# Patient Record
Sex: Female | Born: 1980 | Hispanic: Yes | Marital: Married | Smoking: Never smoker
Health system: Southern US, Community
[De-identification: ages and names within clinical notes are randomized; demographics above are authoritative.]

## PROBLEM LIST (undated history)

## (undated) DIAGNOSIS — R519 Headache, unspecified: Secondary | ICD-10-CM

## (undated) DIAGNOSIS — R51 Headache: Secondary | ICD-10-CM

## (undated) HISTORY — PX: CHOLECYSTECTOMY: SHX55

---

## 2006-02-08 ENCOUNTER — Observation Stay: Payer: Self-pay

## 2006-02-15 ENCOUNTER — Observation Stay: Payer: Self-pay | Admitting: Obstetrics & Gynecology

## 2006-03-02 ENCOUNTER — Observation Stay: Payer: Self-pay

## 2006-03-04 ENCOUNTER — Observation Stay: Payer: Self-pay | Admitting: Obstetrics and Gynecology

## 2006-03-09 ENCOUNTER — Emergency Department: Payer: Self-pay | Admitting: Unknown Physician Specialty

## 2006-03-31 ENCOUNTER — Observation Stay: Payer: Self-pay | Admitting: Obstetrics and Gynecology

## 2006-04-29 ENCOUNTER — Observation Stay: Payer: Self-pay

## 2006-05-20 ENCOUNTER — Inpatient Hospital Stay: Payer: Self-pay | Admitting: Obstetrics and Gynecology

## 2006-07-04 ENCOUNTER — Observation Stay: Payer: Self-pay | Admitting: General Surgery

## 2006-07-09 ENCOUNTER — Ambulatory Visit: Payer: Self-pay | Admitting: General Surgery

## 2007-05-31 ENCOUNTER — Inpatient Hospital Stay: Payer: Self-pay | Admitting: Internal Medicine

## 2007-08-22 ENCOUNTER — Emergency Department: Payer: Self-pay | Admitting: Emergency Medicine

## 2007-10-02 ENCOUNTER — Other Ambulatory Visit: Payer: Self-pay

## 2007-10-02 ENCOUNTER — Emergency Department: Payer: Self-pay | Admitting: Emergency Medicine

## 2008-01-31 ENCOUNTER — Emergency Department: Payer: Self-pay | Admitting: Emergency Medicine

## 2009-03-17 ENCOUNTER — Emergency Department: Payer: Self-pay | Admitting: Emergency Medicine

## 2009-04-05 ENCOUNTER — Inpatient Hospital Stay: Payer: Self-pay | Admitting: Internal Medicine

## 2009-06-29 HISTORY — PX: CHOLECYSTECTOMY: SHX55

## 2009-09-28 ENCOUNTER — Emergency Department: Payer: Self-pay | Admitting: Emergency Medicine

## 2009-10-13 ENCOUNTER — Emergency Department: Payer: Self-pay | Admitting: Internal Medicine

## 2011-05-28 ENCOUNTER — Emergency Department: Payer: Self-pay | Admitting: Emergency Medicine

## 2011-07-09 ENCOUNTER — Ambulatory Visit: Payer: Self-pay | Admitting: Family Medicine

## 2011-07-29 ENCOUNTER — Ambulatory Visit: Payer: Self-pay | Admitting: Family Medicine

## 2011-09-11 ENCOUNTER — Ambulatory Visit: Payer: Self-pay | Admitting: Family Medicine

## 2011-09-13 ENCOUNTER — Observation Stay: Payer: Self-pay | Admitting: Obstetrics and Gynecology

## 2011-09-14 LAB — URINALYSIS, COMPLETE
Glucose,UR: NEGATIVE mg/dL (ref 0–75)
Leukocyte Esterase: NEGATIVE
Nitrite: NEGATIVE
Ph: 8 (ref 4.5–8.0)
Specific Gravity: 1.006 (ref 1.003–1.030)
WBC UR: 1 /HPF (ref 0–5)

## 2011-09-15 LAB — URINE CULTURE

## 2011-11-02 ENCOUNTER — Observation Stay: Payer: Self-pay | Admitting: Obstetrics and Gynecology

## 2011-11-02 LAB — PIH PROFILE
Anion Gap: 10 (ref 7–16)
BUN: 5 mg/dL — ABNORMAL LOW (ref 7–18)
Chloride: 107 mmol/L (ref 98–107)
Co2: 24 mmol/L (ref 21–32)
EGFR (African American): >60
Glucose: 94 mg/dL (ref 65–99)
MCH: 31 pg (ref 26.0–34.0)
MCHC: 34.1 g/dL (ref 32.0–36.0)
MCV: 91 fL (ref 80–100)
Platelet: 113 10*3/uL — ABNORMAL LOW (ref 150–440)
Potassium: 3.5 mmol/L (ref 3.5–5.1)
RBC: 4.1 10*6/uL (ref 3.80–5.20)
SGOT(AST): 19 U/L (ref 15–37)
WBC: 12.5 10*3/uL — ABNORMAL HIGH (ref 3.6–11.0)

## 2011-11-02 LAB — URINALYSIS, COMPLETE
Bilirubin,UR: NEGATIVE
Glucose,UR: NEGATIVE mg/dL (ref 0–75)
RBC,UR: NONE SEEN /HPF (ref 0–5)
Specific Gravity: 1.005 (ref 1.003–1.030)
WBC UR: 1 /HPF (ref 0–5)

## 2011-11-02 LAB — PROTEIN / CREATININE RATIO, URINE
Protein, Random Urine: 7 mg/dL (ref 0–12)
Protein/Creat. Ratio: 258 mg/gCREAT — ABNORMAL HIGH (ref 0–200)

## 2011-11-02 LAB — FETAL FIBRONECTIN
Appearance: NORMAL
Fetal Fibronectin: NEGATIVE

## 2011-11-16 ENCOUNTER — Observation Stay: Payer: Self-pay | Admitting: Obstetrics and Gynecology

## 2011-12-03 ENCOUNTER — Inpatient Hospital Stay: Payer: Self-pay

## 2011-12-04 LAB — COMPREHENSIVE METABOLIC PANEL
Albumin: 2.6 g/dL — ABNORMAL LOW (ref 3.4–5.0)
Anion Gap: 12 (ref 7–16)
BUN: 10 mg/dL (ref 7–18)
Bilirubin,Total: 0.4 mg/dL (ref 0.2–1.0)
Calcium, Total: 8.4 mg/dL — ABNORMAL LOW (ref 8.5–10.1)
Co2: 22 mmol/L (ref 21–32)
EGFR (African American): >60
Osmolality: 280 (ref 275–301)
SGOT(AST): 26 U/L (ref 15–37)
Sodium: 141 mmol/L (ref 136–145)
Total Protein: 7 g/dL (ref 6.4–8.2)

## 2011-12-04 LAB — CBC WITH DIFFERENTIAL/PLATELET
Basophil #: 0 10*3/uL
Basophil %: 0.1 %
Basophil %: 0.4 %
Eosinophil #: 1.1 10*3/uL — ABNORMAL HIGH
Eosinophil #: 0.3 10*3/uL (ref 0.0–0.7)
Eosinophil %: 8.5 %
HCT: 37.9 %
HGB: 13.1 g/dL
HGB: 13 g/dL (ref 12.0–16.0)
Lymphocyte #: 1.6 10*3/uL (ref 1.0–3.6)
Lymphocyte %: 8.5 %
Lymphocyte %: 11.6 %
Lymphs Abs: 1.1 10*3/uL
MCH: 31.4 pg
MCH: 30.4 pg (ref 26.0–34.0)
MCHC: 34.5 g/dL
MCHC: 33 g/dL (ref 32.0–36.0)
MCV: 91 fL
MCV: 92 fL (ref 80–100)
Monocyte #: 0.4 10*3/uL
Monocyte %: 3.3 %
Neutrophil #: 10.5 10*3/uL — ABNORMAL HIGH
Neutrophil #: 11 10*3/uL — ABNORMAL HIGH (ref 1.4–6.5)
Neutrophil %: 79.6 %
Neutrophil %: 80.2 %
Platelet: 107 10*3/uL — ABNORMAL LOW
RBC: 4.16 X10 6/mm 3
RBC: 4.26 10*6/uL (ref 3.80–5.20)
RDW: 14.2 %
RDW: 14.5 % (ref 11.5–14.5)
WBC: 13.2 10*3/uL — ABNORMAL HIGH
WBC: 13.7 10*3/uL — ABNORMAL HIGH (ref 3.6–11.0)

## 2011-12-05 LAB — HEMATOCRIT: HCT: 29.2 % — ABNORMAL LOW (ref 35.0–47.0)

## 2011-12-06 LAB — COMPREHENSIVE METABOLIC PANEL
Albumin: 1.9 g/dL — ABNORMAL LOW (ref 3.4–5.0)
Alkaline Phosphatase: 190 U/L — ABNORMAL HIGH (ref 50–136)
Anion Gap: 11 (ref 7–16)
BUN: 6 mg/dL — ABNORMAL LOW (ref 7–18)
Bilirubin,Total: 0.3 mg/dL (ref 0.2–1.0)
Chloride: 107 mmol/L (ref 98–107)
Creatinine: 0.44 mg/dL — ABNORMAL LOW (ref 0.60–1.30)
EGFR (African American): >60
EGFR (Non-African Amer.): >60
Glucose: 92 mg/dL (ref 65–99)
Osmolality: 284 (ref 275–301)
SGOT(AST): 28 U/L (ref 15–37)
SGPT (ALT): 18 U/L
Sodium: 144 mmol/L (ref 136–145)
Total Protein: 6.1 g/dL — ABNORMAL LOW (ref 6.4–8.2)

## 2011-12-06 LAB — URINALYSIS, COMPLETE
Blood: NEGATIVE
Glucose,UR: NEGATIVE mg/dL (ref 0–75)
Ketone: NEGATIVE
Nitrite: NEGATIVE
Ph: 7 (ref 4.5–8.0)
Protein: NEGATIVE
RBC,UR: <1 /HPF (ref 0–5)
Specific Gravity: 1.008 (ref 1.003–1.030)
WBC UR: <1 /HPF (ref 0–5)

## 2011-12-06 LAB — CBC WITH DIFFERENTIAL/PLATELET
Eosinophil #: 0.2 10*3/uL (ref 0.0–0.7)
Eosinophil %: 1.6 %
HCT: 28 % — ABNORMAL LOW (ref 35.0–47.0)
Lymphocyte #: 1.5 10*3/uL (ref 1.0–3.6)
Lymphocyte %: 11.2 %
MCH: 30.9 pg (ref 26.0–34.0)
MCHC: 33.7 g/dL (ref 32.0–36.0)
Monocyte #: 0.9 x10 3/mm (ref 0.2–0.9)
Monocyte %: 6.6 %
Neutrophil #: 10.9 10*3/uL — ABNORMAL HIGH (ref 1.4–6.5)
Neutrophil %: 80.4 %
Platelet: 110 10*3/uL — ABNORMAL LOW (ref 150–440)
RBC: 3.05 10*6/uL — ABNORMAL LOW (ref 3.80–5.20)
RDW: 14.6 % — ABNORMAL HIGH (ref 11.5–14.5)
WBC: 13.5 10*3/uL — ABNORMAL HIGH (ref 3.6–11.0)

## 2011-12-07 LAB — CBC WITH DIFFERENTIAL/PLATELET
HCT: 27.7 % — ABNORMAL LOW (ref 35.0–47.0)
HGB: 9.5 g/dL — ABNORMAL LOW (ref 12.0–16.0)
Lymphocyte #: 1.4 10*3/uL (ref 1.0–3.6)
MCHC: 34.1 g/dL (ref 32.0–36.0)
MCV: 92 fL (ref 80–100)
Monocyte %: 3.9 %
Neutrophil %: 78.9 %
RBC: 3 10*6/uL — ABNORMAL LOW (ref 3.80–5.20)
RDW: 14.4 % (ref 11.5–14.5)
WBC: 11.6 10*3/uL — ABNORMAL HIGH (ref 3.6–11.0)

## 2011-12-07 LAB — PATHOLOGY REPORT

## 2012-12-04 IMAGING — US US OB FOLLOW-UP
1 series · 14 of 28 positions shown · non-contrast
Comparison: none

REASON FOR EXAM: TB medication  low level anatomy
COMMENTS:

[Series 1: us ob follow-up · 0.33mm/px · 14 of 62 slices shown]
[im 3/62]
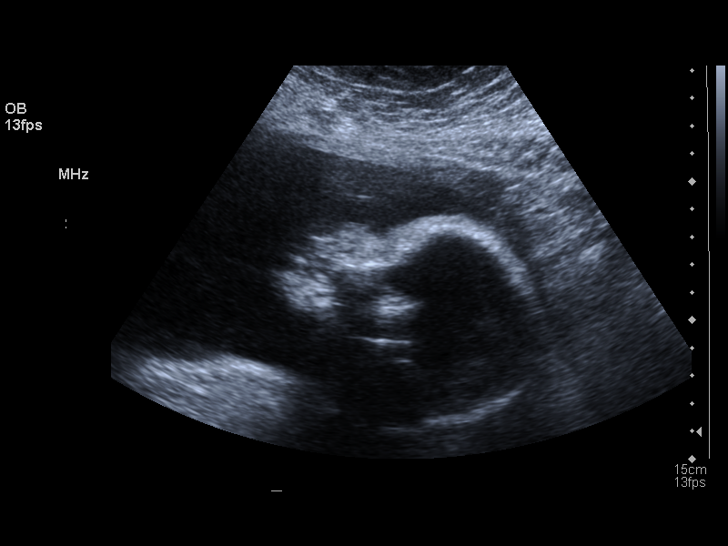
[im 7/62]
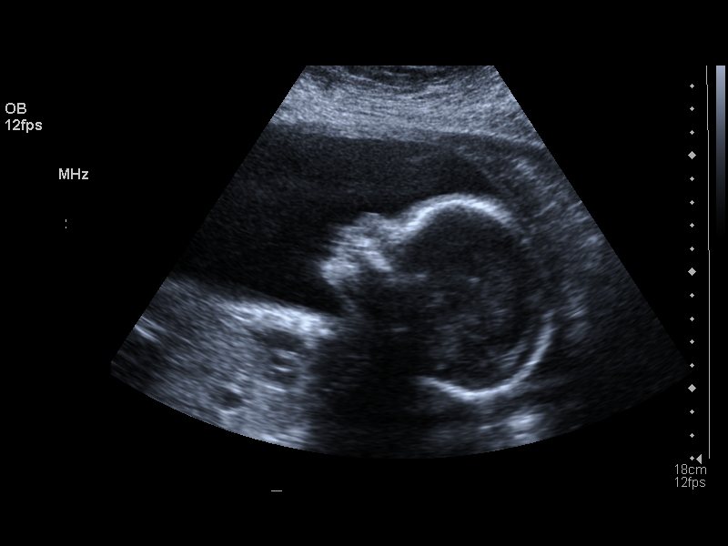
[im 12/62]
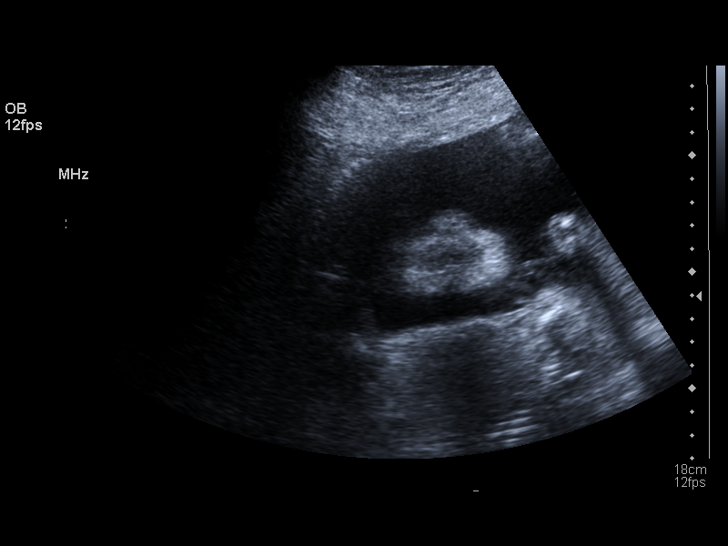
[im 16/62]
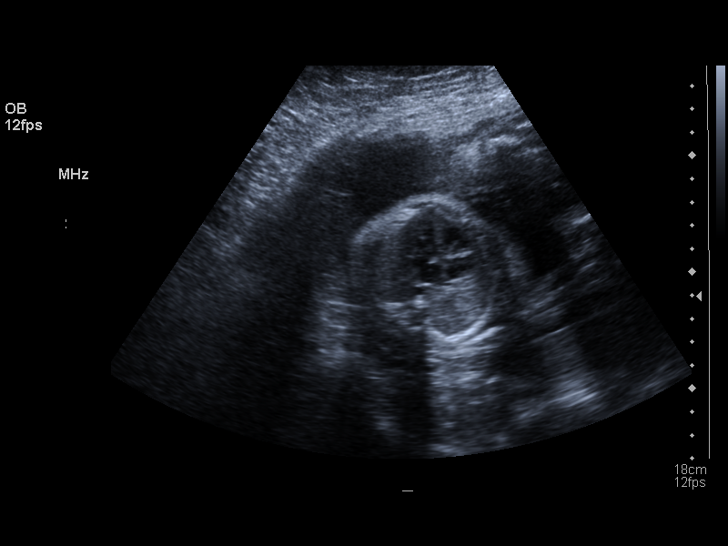
[im 21/62]
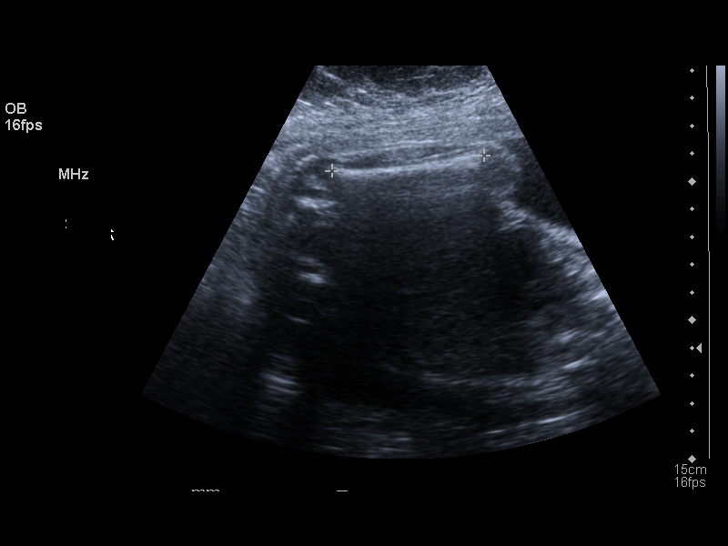
[im 25/62]
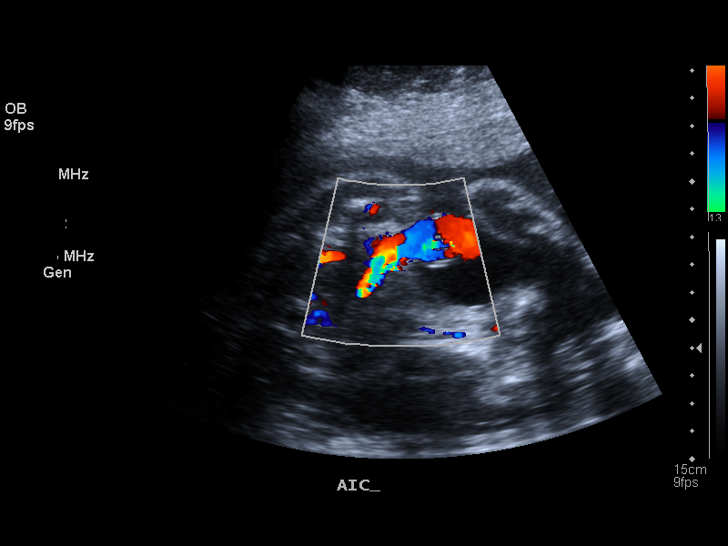
[im 30/62]
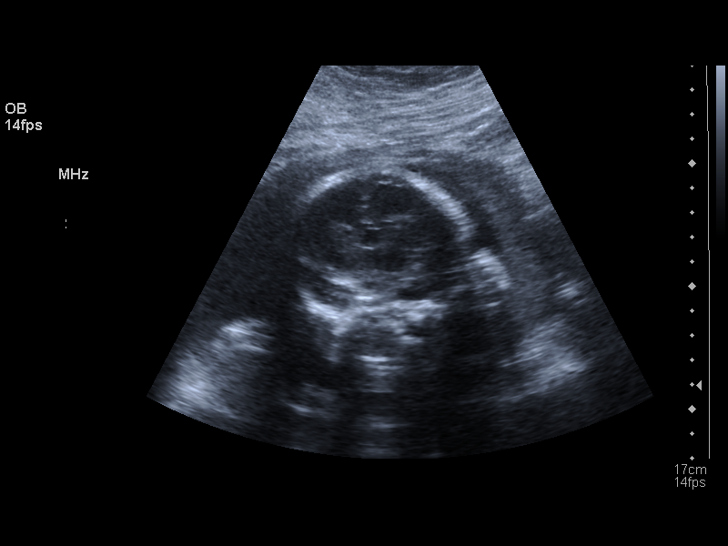
[im 34/62]
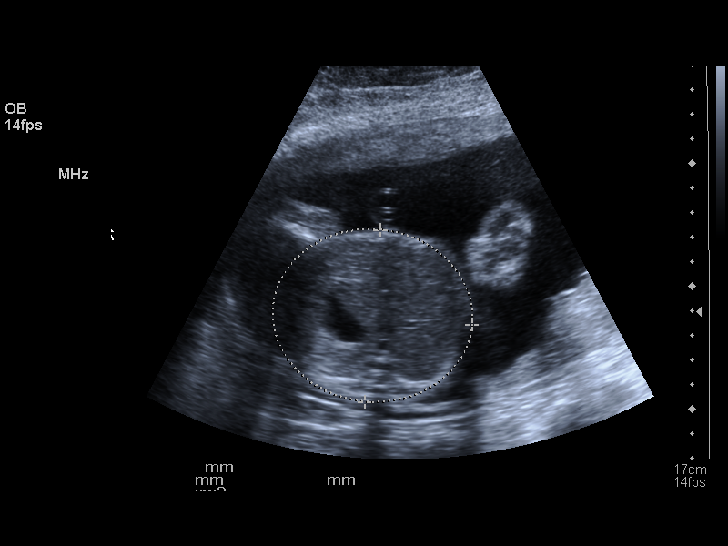
[im 39/62]
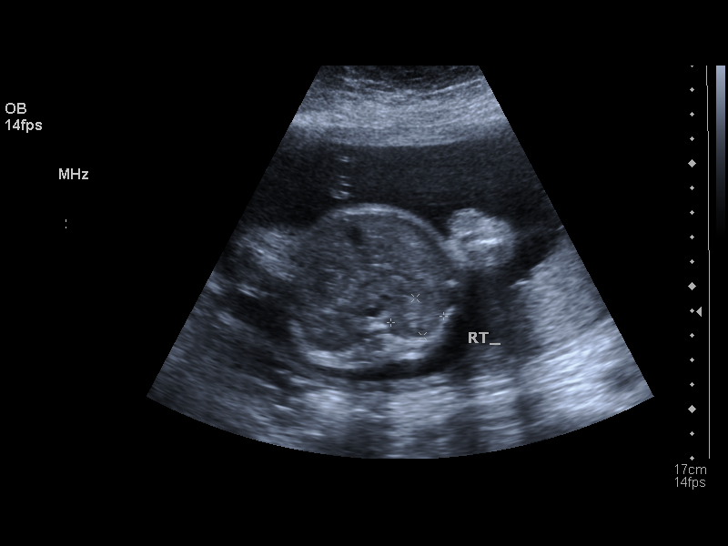
[im 43/62]
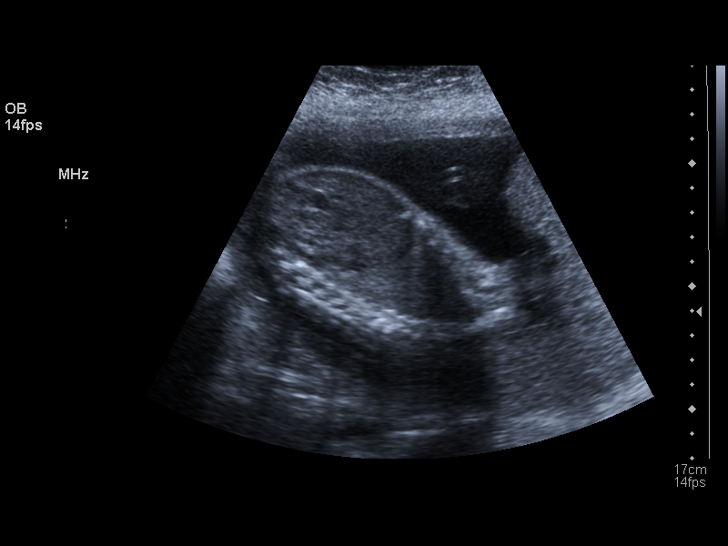
[im 48/62]
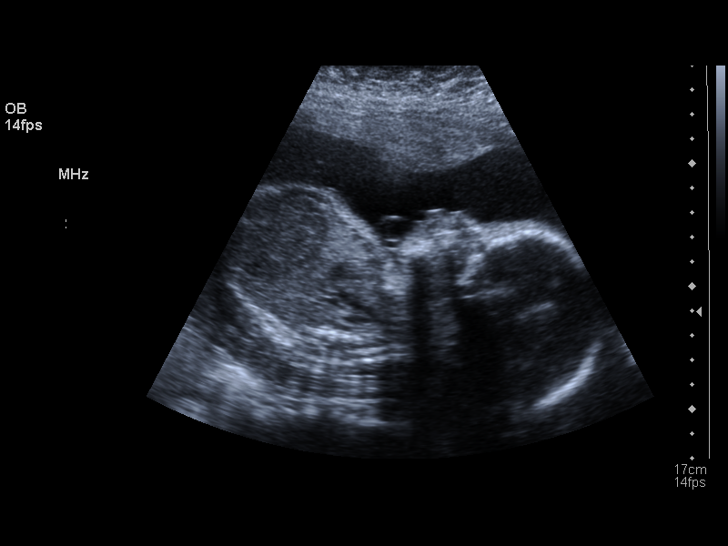
[im 52/62]
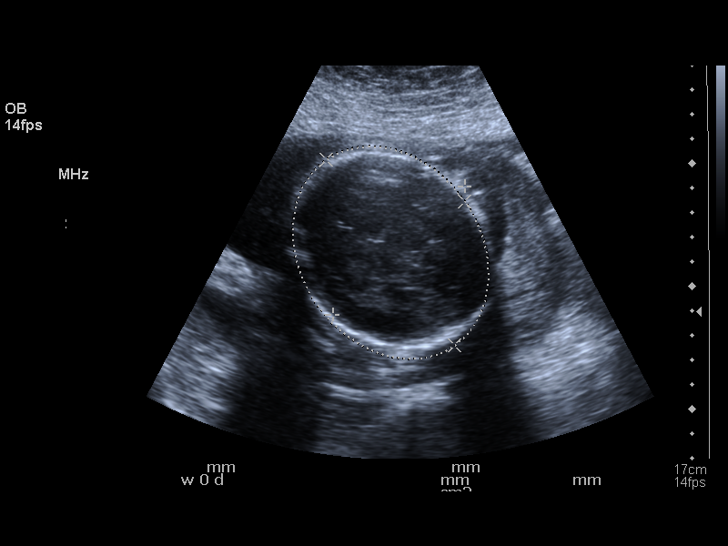
[im 57/62]
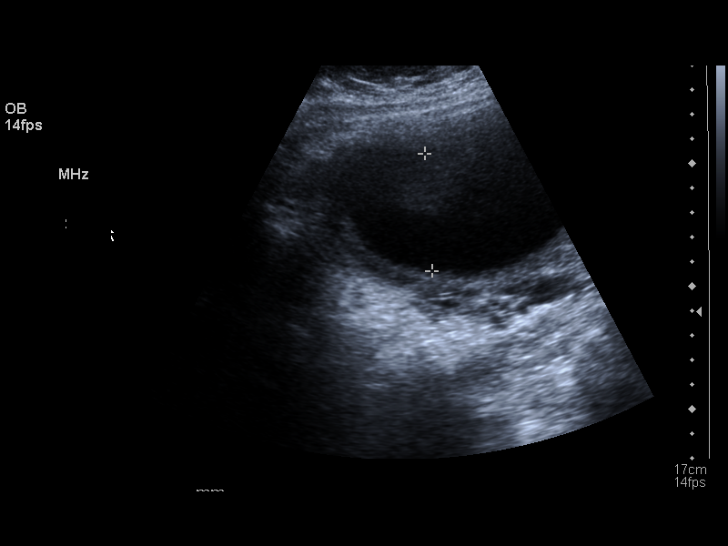
[im 62/62]
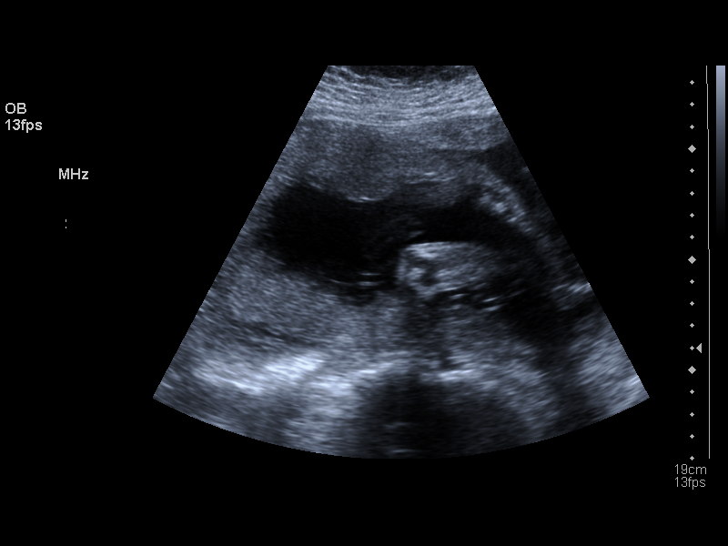

[14 of 28 positions shown; findings below may reference images not displayed]

PROCEDURE:     US  - US OB FOLLOW UP  - September 11, 2011  [DATE]

RESULT:     Repeat OB examination shows a single living intrauterine
gestation. Presentation currently is cephalic. Fetal heart rate was
monitored at 147 beats/minute. Amniotic fluid volume appears normal. AFI
measures 16.3 cm. The placenta is fundal in location. The inferior tip of
the placenta is approximately 6.4 cm above the cervix. The fetal heart,
stomach, and urinary bladder are visualized. No hydrocephalus or
hydronephrosis is seen. Fetal measurements are as follows:

BPD     74.8 mm      Corresponding to 30 weeks 0 days.
HC             259.7 mm     Corresponding to 28 weeks 2 days.
AC         238.1 mm      Corresponding to 28 weeks 1 day.
FL          55.1 mm      Corresponding to 29 weeks 0 days.

EFW is 1,248 grams + / -185 grams. AFI measures 16.3 cm. Average ultrasound
age is 28 weeks 6 days. Ultrasound EDD based on today's measurements is November 28, 2011. This is 11 days less than the ultrasound EDD predicted on the prior
exam of July 29, 2011.
IMPRESSION: Please see above.

## 2013-12-11 ENCOUNTER — Emergency Department: Payer: Self-pay | Admitting: Emergency Medicine

## 2013-12-11 LAB — COMPREHENSIVE METABOLIC PANEL
ALT: 49 U/L (ref 12–78)
AST: 31 U/L (ref 15–37)
Albumin: 4 g/dL (ref 3.4–5.0)
Alkaline Phosphatase: 140 U/L — ABNORMAL HIGH
Anion Gap: 5 — ABNORMAL LOW (ref 7–16)
BUN: 12 mg/dL (ref 7–18)
Bilirubin,Total: 0.6 mg/dL (ref 0.2–1.0)
CALCIUM: 9.1 mg/dL (ref 8.5–10.1)
CREATININE: 0.75 mg/dL (ref 0.60–1.30)
Chloride: 106 mmol/L (ref 98–107)
Co2: 27 mmol/L (ref 21–32)
EGFR (African American): >60
EGFR (Non-African Amer.): >60
Glucose: 84 mg/dL (ref 65–99)
OSMOLALITY: 275 (ref 275–301)
Potassium: 3.7 mmol/L (ref 3.5–5.1)
SODIUM: 138 mmol/L (ref 136–145)
Total Protein: 8.1 g/dL (ref 6.4–8.2)

## 2013-12-11 LAB — CBC
HCT: 45.2 % (ref 35.0–47.0)
HGB: 14.8 g/dL (ref 12.0–16.0)
MCH: 29.4 pg (ref 26.0–34.0)
MCHC: 32.8 g/dL (ref 32.0–36.0)
MCV: 90 fL (ref 80–100)
Platelet: 188 10*3/uL (ref 150–440)
RBC: 5.03 10*6/uL (ref 3.80–5.20)
RDW: 13.1 % (ref 11.5–14.5)
WBC: 17.1 10*3/uL — ABNORMAL HIGH (ref 3.6–11.0)

## 2013-12-11 LAB — URINALYSIS, COMPLETE
Bilirubin,UR: NEGATIVE
Glucose,UR: NEGATIVE mg/dL (ref 0–75)
Ketone: NEGATIVE
Nitrite: POSITIVE
Ph: 6 (ref 4.5–8.0)
Specific Gravity: 1.009 (ref 1.003–1.030)
Squamous Epithelial: 6

## 2013-12-11 LAB — LIPASE, BLOOD: Lipase: 105 U/L (ref 73–393)

## 2013-12-13 LAB — URINE CULTURE

## 2014-10-21 NOTE — Op Note (Signed)
PATIENT NAME:  Kaitlyn Carey, Kaitlyn Carey MR#:  [id number] DATE OF BIRTH:  26-Oct-1980  DATE OF PROCEDURE:  12/04/2011  PREOPERATIVE DIAGNOSES:  1. Active phase arrest.  2. Elective permanent sterilization.   POSTOPERATIVE DIAGNOSES: 1. Active phase arrest.  2. Elective permanent sterilization.   PROCEDURES:  1. Bilateral tubal ligation. 2. Primary low transverse cesarean section.   SURGEON: Suzy Bouchard, M.D.   FIRST ASSISTANT: Street - Stage manager.   ANESTHESIA: Spinal.   INDICATION: This is a 34 year old gravida 7, para 6, a patient who labored for multiple hours and did not progress past 6 cm despite adequate contraction pattern. The patient elected for permanent sterilization and was reconfirmed during the procedure.   DESCRIPTION OF PROCEDURE: After adequate spinal anesthesia, the patient was placed in the dorsal supine position with a hip roll under the right side. The patient's abdomen was prepped and draped in normal sterile fashion. A Pfannenstiel incision was made two fingerbreadths above the symphysis pubis. Sharp dissection was used to identify the fascia. The fascia was opened in the midline and opened in a transverse fashion. The superior aspect of the fascia was grasped with Kocher clamps and the recti muscles dissected free. The inferior aspect of the fascia was grasped with Kocher clamps. Pyramidalis muscle was dissected free. Entry into the peritoneal cavity was accomplished sharply. The vesicouterine peritoneal fold was identified and opened in a transverse fashion. The bladder was reflected inferiorly. A low transverse uterine incision was made. Upon entry into the endometrial cavity, clear fluid resulted. An extremely wedged head was documented, asynclitic. The head was delivered through the incision. A loose nuchal cord was reduced and shoulders were delivered without difficulty, body and legs delivered without difficulty. A floppy female was passed to Dr. Abundio Miu who  assigned Apgar scores of 1 and 9. The placenta was manually delivered and the uterus was exteriorized and wiped clean with laparotomy tape. The uterine incision was closed with one chromic suture in a running locking fashion. Several figure-of-eight sutures were utilized to close all defects. There was a small extension on the left lower uterine segment aspect and on the right lower uterine segment aspect as well.   Attention was directed to the patient's right fallopian tube which was grasped in the midportion. Two separate 0 plain gut sutures were applied on to the fallopian tube and a 1.5 cm portion of the fallopian tube removed. Good hemostasis was noted. A similar procedure was repeated on the patient's left fallopian tube. After visualizing the fimbriated end, two separate 0 plain gut sutures were applied and a 1.5 cm portion of the fallopian tube was removed. Good hemostasis was noted.   The posterior cul-de-sac was irrigated and suctioned. The uterus was placed back into the abdominal cavity and the paracolic gutters were wiped clean. The tubal ligation sites appeared hemostatic and the uterine incision appeared hemostatic. Interceed was placed over the uterine incision, in a T-shaped fashion. Two Kochers were used to elevate the anterior fascia and the On-Q pump needles were placed subfascially starting at the infraumbilical puncture area. The fascia was then closed over top the previously placed catheters with 0 Vicryl suture in a running nonlocking fashion with good approximation of edges noted. Subcutaneous tissues were irrigated and bovied and the subcutaneous tissues were closed with a 2-0 chromic suture given excessive subcutaneous tissue and the skin was reapproximated with staples. The On-Q pump catheters were secured at the skin with Dermabond and steri-stripped down to the skin and were  then loaded with 5 mL of 0.5% Marcaine. There were no complications. Estimated blood loss 600 mL.  Intraoperative fluids 1600 mL. The patient did receive 2 grams IV Ancef prior to commencement of the case. She was taken to the recovery room in good condition.  ____________________________ Suzy Bouchardhomas J. Ark Agrusa, MD tjs:slb D: 12/04/2011 12:13:40 ET     T: 12/04/2011 12:46:50 ET        JOB#: [id number] cc: Suzy Bouchardhomas J. Kieran Nachtigal, MD, <Dictator> Suzy BouchardHOMAS J Marlen Mollica MD ELECTRONICALLY SIGNED 12/04/2011 19:54

## 2014-10-21 NOTE — Discharge Summary (Signed)
PATIENT NAME:  Kaitlyn Carey, [name] MR#:  [id number] DATE OF BIRTH:  February 18, 1981  DATE OF ADMISSION:  12/03/2011 DATE OF DISCHARGE:  12/08/2011  PRINCIPAL PROCEDURES: Low transverse cesarean section, bilateral tubal ligation.   HOSPITAL COURSE: The patient developed a fever on postoperative day two to 100.8.  The patient was started on clindamycin and gentamicin for presumed postpartum endometritis. Postoperative day one hematocrit was 28.2. Postoperative day three, white blood count was 11.6. The patient was still having intermittent fevers. Postoperative day four, the patient was 24 hours afebrile and feeling well. The patient was discharged to home in good condition.   DISCHARGE MEDICATIONS: Vicodin, prenatal vitamins and ibuprofen.   FOLLOW UP: The patient will follow up with Dr. Feliberto GottronSchermerhorn in two weeks for wound care or before if she has fever, nausea, vomiting, increasing abdominal pain.   ____________________________ Suzy Bouchardhomas J. Schermerhorn, MD tjs:cbb D: 12/11/2011 11:07:31 ET T: 12/12/2011 15:14:42 ET JOB#: [id number]  cc: Suzy Bouchardhomas J. Schermerhorn, MD, <Dictator> Suzy BouchardHOMAS J SCHERMERHORN MD ELECTRONICALLY SIGNED 12/18/2011 9:49

## 2015-04-07 ENCOUNTER — Emergency Department
Admission: EM | Admit: 2015-04-07 | Discharge: 2015-04-07 | Disposition: A | Discharge status: Home / Self Care | Payer: Self-pay | Attending: Emergency Medicine | Admitting: Emergency Medicine

## 2015-04-07 ENCOUNTER — Encounter: Payer: Self-pay | Admitting: Emergency Medicine

## 2015-04-07 DIAGNOSIS — G44219 Episodic tension-type headache, not intractable: Secondary | ICD-10-CM | POA: Insufficient documentation

## 2015-04-07 DIAGNOSIS — Z79899 Other long term (current) drug therapy: Secondary | ICD-10-CM | POA: Insufficient documentation

## 2015-04-07 HISTORY — DX: Headache: R51

## 2015-04-07 HISTORY — DX: Headache, unspecified: R51.9

## 2015-04-07 MED ORDER — IBUPROFEN 800 MG PO TABS: 800.0000 mg | ORAL_TABLET | Freq: Three times a day (TID) | ORAL | Status: DC | PRN

## 2015-04-07 MED ORDER — METOCLOPRAMIDE HCL 5 MG/ML IJ SOLN
10.0000 mg | Freq: Once | INTRAMUSCULAR | Status: AC
  Administered 2015-04-07: 10 mg via INTRAVENOUS
  Filled 2015-04-07: qty 2

## 2015-04-07 MED ORDER — LORAZEPAM 2 MG/ML IJ SOLN
1.0000 mg | Freq: Once | INTRAMUSCULAR | Status: AC
  Administered 2015-04-07: 1 mg via INTRAVENOUS
  Filled 2015-04-07: qty 1

## 2015-04-07 MED ORDER — SODIUM CHLORIDE 0.9 % IV BOLUS (SEPSIS)
1000.0000 mL | Freq: Once | INTRAVENOUS | Status: AC
  Administered 2015-04-07: 1000 mL via INTRAVENOUS

## 2015-04-07 MED ORDER — KETOROLAC TROMETHAMINE 30 MG/ML IJ SOLN
30.0000 mg | Freq: Once | INTRAMUSCULAR | Status: AC
  Administered 2015-04-07: 30 mg via INTRAVENOUS
  Filled 2015-04-07: qty 1

## 2015-04-07 MED ORDER — PROMETHAZINE HCL 12.5 MG PO TABS: 12.5000 mg | ORAL_TABLET | Freq: Four times a day (QID) | ORAL | Status: DC | PRN

## 2015-04-07 MED ORDER — LORAZEPAM 1 MG PO TABS: 1.0000 mg | ORAL_TABLET | Freq: Three times a day (TID) | ORAL | Status: DC | PRN

## 2015-04-07 NOTE — Discharge Instructions (Signed)
Cefalea tensional (Tension Headache) Una cefalea tensional es un dolor o presin que se siente en la frente y los lados de la cabeza. Estas cefaleas pueden durar de 30minutos a varios das. CUIDADOS EN EL HOGAR Control del dolor  Tome los medicamentos de venta libre y los recetados solamente como se lo haya indicado el mdico.  Cuando sienta dolor de cabeza acustese en un cuarto oscuro y tranquilo.  Si se lo indican, aplquese hielo en la zona de la cabeza y el cuello:  Ponga el hielo en una bolsa plstica.  Coloque una toalla entre la piel y la bolsa de hielo.  Coloque el hielo durante 20minutos, 2 a 3veces por da.  Utilice una almohadilla trmica o tome una ducha caliente para aplicar calor en la zona de la cabeza y el cuello segn las indicaciones del mdico. Comida y bebida  Mantenga un horario para las comidas.  No beba mucho alcohol.  No consuma mucha cafena ni deje de consumirla por completo. Instrucciones generales  Concurra a todas las visitas de control como se lo haya indicado el mdico. Esto es importante.  Lleve un registro diario para averiguar si ciertas cosas provocan los dolores de cabeza. Por ejemplo, escriba los siguientes datos:  Lo que usted come y bebe.  Cunto tiempo duerme.  Algn cambio en su dieta o en los medicamentos.  Pruebe recibir un masaje o hacer otras cosas que lo ayuden a relajarse.  Disminuya el nivel de estrs.  Sintese con la espalda recta. No contraiga (tensione) los msculos.  No consuma productos que contengan tabaco. Estos incluyen cigarrillos, tabaco para mascar y cigarrillos electrnicos. Si necesita ayuda para dejar de fumar, consulte al mdico.  Haga ejercicios con regularidad tal como se lo indic el mdico.  Duerma lo suficiente. Es decir, entre 7 y 9horas de sueo. SOLICITE AYUDA SI:  Los medicamentos no logran aliviar los sntomas.  Tiene un dolor de cabeza que es diferente del dolor de cabeza  habitual.  Tiene malestar estomacal (nuseas) o vomita.  Tiene fiebre. SOLICITE AYUDA DE INMEDIATO SI:  La cefalea es muy intensa.  Sigue vomitando.  Presenta rigidez en el cuello.  Tiene dificultad para ver.  Tiene dificultad para hablar.  Siente dolor en el ojo o en el odo.  Sus msculos estn dbiles, o pierde el control muscular.  Pierde el equilibrio o tiene problemas para caminar.  Siente que se desvanece (pierde el conocimiento) o se desmaya.  Se siente confundido.   Esta informacin no tiene como fin reemplazar el consejo del mdico. Asegrese de hacerle al mdico cualquier pregunta que tenga.   Document Released: 09/07/2011 Document Revised: 03/06/2015 Elsevier Interactive Patient Education 2016 [employer]  

## 2015-04-07 NOTE — ED Notes (Signed)
Spanish interpreter requested for dc instructions.

## 2015-04-07 NOTE — ED Notes (Signed)
Interpreter request sent from stat registration

## 2015-04-07 NOTE — ED Notes (Signed)
Interpreter present; pt says she had a headache on Monday and Tuesday of last week but it went away; this am the headache returned, worse than before; pain to the back of her neck that radiates up into her whole head; "feels like it's going to explode"; nausea, no vomiting; sensitive to lights and sounds; pt tearful in triage; denies fall; pt admits to some stressors at home

## 2015-04-07 NOTE — ED Provider Notes (Signed)
Time Seen: Approximately ----------------------------------------- 8:49 PM on 04/07/2015 -----------------------------------------    I have reviewed the triage notes  Chief Complaint: Migraine   History of Present Illness: Kaitlyn Carey is a 34 y.o. female who presents with headache this evening. She describes the headache as starting in the back of her neck and moving forward toward the temporal region. Sensation states she's had similar headaches over the past year. This one seemed to be slightly more intense. At some nausea with no vomiting. She denies any fever or photophobia at home. Her husband states she tried some Mucinex without any pain relief. Patient denies any allergies. The patient herself is primarily Spanish-speaking and her history and review of systems and physical exam were taken with the interpreter. Patient denies any focal weakness or head trauma. She denies any risk of being pregnant. She denies any focal weakness in either upper or lower extremities. She states she had similar headaches on Monday and Tuesday of last week but seemed to resolve on their own.   Past Medical History  Diagnosis Date  . Headache     There are no active problems to display for this patient.   Past Surgical History  Procedure Laterality Date  . Cholecystectomy  2011  . Cesarean section  2013    Past Surgical History  Procedure Laterality Date  . Cholecystectomy  2011  . Cesarean section  2013    Current Outpatient Rx  Name  Route  Sig  Dispense  Refill  . guaiFENesin (MUCINEX) 600 MG 12 hr tablet   Oral   Take 1,200 mg by mouth once.         Marland Kitchen ibuprofen (ADVIL) 200 MG tablet   Oral   Take 400 mg by mouth every 6 (six) hours as needed for headache, mild pain or moderate pain.           Allergies:  Review of patient's allergies indicates no known allergies.  Family History: History reviewed. No pertinent family history.  Social History: Social History   Substance Use Topics  . Smoking status: Never Smoker   . Smokeless tobacco: None  . Alcohol Use: No     Review of Systems:   10 point review of systems was performed and was otherwise negative:  Constitutional: No fever Eyes: No visual disturbances ENT: No sore throat, ear pain Cardiac: No chest pain Respiratory: No shortness of breath, wheezing, or stridor Abdomen: No abdominal pain, no vomiting, No diarrhea Endocrine: No weight loss, No night sweats Extremities: No peripheral edema, cyanosis Skin: No rashes, easy bruising Neurologic: No focal weakness, trouble with speech or swollowing Urologic: No dysuria, Hematuria, or urinary frequency   Physical Exam:  ED Triage Vitals  Enc Vitals Group     BP 04/07/15 1951 135/96 mmHg     Pulse Rate 04/07/15 1951 84     Resp 04/07/15 1951 20     Temp 04/07/15 1951 98.2 F (36.8 C)     Temp Source 04/07/15 1951 Oral     SpO2 04/07/15 1951 98 %     Weight 04/07/15 1951 140 lb (63.504 kg)     Height 04/07/15 1951  (1.295 m)     Head Cir --      Peak Flow --      Pain Score 04/07/15 1955 10     Pain Loc --      Pain Edu? --      Excl. in GC? --  General: Awake , Alert , and Oriented times 3; GCS 15 Head: Normal cephalic , atraumatic. Some reproducible component with palpation across the occipital and temporal region. Eyes: Pupils equal , round, reactive to light Nose/Throat: No nasal drainage, patent upper airway without erythema or exudate.  Neck: Supple, Full range of motion, no meningeal signs, No anterior adenopathy or palpable thyroid masses Lungs: Clear to ascultation without wheezes , rhonchi, or rales Heart: Regular rate, regular rhythm without murmurs , gallops , or rubs Abdomen: Soft, non tender without rebound, guarding , or rigidity; bowel sounds positive and symmetric in all 4 quadrants. No organomegaly .        Extremities: 2 plus symmetric pulses. No edema, clubbing or cyanosis Neurologic: normal  ambulation, Motor symmetric without deficits, sensory intact Skin: warm, dry, no rashes    ED Course: * Patient's stay here was uneventful and based on her clinical description of her headache I felt this was a muscle tension headache. Patient had symptomatic improvement with a liter of fluid, IV Reglan, IV Toradol, and low-dose of IV Ativan. Differential diagnosis includes but is not exclusive to subarachnoid hemorrhage, meningitis, encephalitis, previous head trauma, cavernous venous thrombosis, muscle tension headache, temporal arteritis, migraine or migraine equivalent, etc. Again given the clinical presentation I felt this did not require any further investigation such as head CT or lumbar puncture.   Assessment:  Muscle tension headache      Plan:  Outpatient management Patient was advised to return immediately if condition worsens. Patient was advised to follow up with her primary care physician or other specialized physicians involved and in their current assessment. Patient was prescribed Phenergan, prescription strength ibuprofen, and Ativan as needed. Instructions on the medications were given through the interpreter along with the discharge plan.            Jennye Moccasin, MD 04/08/15 [phone]

## 2015-04-07 NOTE — ED Notes (Signed)
Feeling significantly better. Calm cooperative, now speaking and smiling. Ambulated to RR. Voided x 1.

## 2015-09-09 ENCOUNTER — Emergency Department
Admission: EM | Admit: 2015-09-09 | Discharge: 2015-09-10 | Disposition: A | Discharge status: Other Acute Inpatient Hospital | Payer: Self-pay | Attending: Emergency Medicine | Admitting: Emergency Medicine

## 2015-09-09 ENCOUNTER — Encounter: Payer: Self-pay | Admitting: Emergency Medicine

## 2015-09-09 DIAGNOSIS — R000 Tachycardia, unspecified: Secondary | ICD-10-CM | POA: Insufficient documentation

## 2015-09-09 DIAGNOSIS — T433X2A Poisoning by phenothiazine antipsychotics and neuroleptics, intentional self-harm, initial encounter: Secondary | ICD-10-CM | POA: Insufficient documentation

## 2015-09-09 DIAGNOSIS — Y9389 Activity, other specified: Secondary | ICD-10-CM | POA: Insufficient documentation

## 2015-09-09 DIAGNOSIS — F329 Major depressive disorder, single episode, unspecified: Secondary | ICD-10-CM | POA: Insufficient documentation

## 2015-09-09 DIAGNOSIS — T402X2A Poisoning by other opioids, intentional self-harm, initial encounter: Secondary | ICD-10-CM | POA: Insufficient documentation

## 2015-09-09 DIAGNOSIS — Z79899 Other long term (current) drug therapy: Secondary | ICD-10-CM | POA: Insufficient documentation

## 2015-09-09 DIAGNOSIS — Y9289 Other specified places as the place of occurrence of the external cause: Secondary | ICD-10-CM | POA: Insufficient documentation

## 2015-09-09 DIAGNOSIS — Y998 Other external cause status: Secondary | ICD-10-CM | POA: Insufficient documentation

## 2015-09-09 DIAGNOSIS — T50902A Poisoning by unspecified drugs, medicaments and biological substances, intentional self-harm, initial encounter: Secondary | ICD-10-CM

## 2015-09-09 DIAGNOSIS — Z3202 Encounter for pregnancy test, result negative: Secondary | ICD-10-CM | POA: Insufficient documentation

## 2015-09-09 DIAGNOSIS — T450X2A Poisoning by antiallergic and antiemetic drugs, intentional self-harm, initial encounter: Secondary | ICD-10-CM | POA: Insufficient documentation

## 2015-09-09 DIAGNOSIS — T391X2A Poisoning by 4-Aminophenol derivatives, intentional self-harm, initial encounter: Secondary | ICD-10-CM | POA: Insufficient documentation

## 2015-09-09 LAB — CBC WITH DIFFERENTIAL/PLATELET
Basophils Absolute: 0 10*3/uL (ref 0–0.1)
Basophils Relative: 0 %
EOS PCT: 6 %
Eosinophils Absolute: 0.5 10*3/uL (ref 0–0.7)
HEMATOCRIT: 42.3 % (ref 35.0–47.0)
Hemoglobin: 14.5 g/dL (ref 12.0–16.0)
LYMPHS ABS: 2.4 10*3/uL (ref 1.0–3.6)
LYMPHS PCT: 33 %
MCH: 31.1 pg (ref 26.0–34.0)
MCHC: 34.3 g/dL (ref 32.0–36.0)
MCV: 90.5 fL (ref 80.0–100.0)
MONO ABS: 0.5 10*3/uL (ref 0.2–0.9)
MONOS PCT: 7 %
NEUTROS ABS: 4 10*3/uL (ref 1.4–6.5)
Neutrophils Relative %: 54 %
PLATELETS: 147 10*3/uL — AB (ref 150–440)
RBC: 4.67 MIL/uL (ref 3.80–5.20)
RDW: 13.6 % (ref 11.5–14.5)
WBC: 7.4 10*3/uL (ref 3.6–11.0)

## 2015-09-09 LAB — COMPREHENSIVE METABOLIC PANEL
ALBUMIN: 4.2 g/dL (ref 3.5–5.0)
ALT: 65 U/L — AB (ref 14–54)
AST: 47 U/L — AB (ref 15–41)
Alkaline Phosphatase: 72 U/L (ref 38–126)
Anion gap: 7 (ref 5–15)
BUN: 12 mg/dL (ref 6–20)
CO2: 25 mmol/L (ref 22–32)
CREATININE: 0.57 mg/dL (ref 0.44–1.00)
Calcium: 9 mg/dL (ref 8.9–10.3)
Chloride: 108 mmol/L (ref 101–111)
GFR calc Af Amer: >60 mL/min (ref >60)
GLUCOSE: 87 mg/dL (ref 65–99)
POTASSIUM: 3.5 mmol/L (ref 3.5–5.1)
SODIUM: 140 mmol/L (ref 135–145)
Total Bilirubin: 0.8 mg/dL (ref 0.3–1.2)
Total Protein: 7.3 g/dL (ref 6.5–8.1)

## 2015-09-09 LAB — PREGNANCY, URINE: PREG TEST UR: NEGATIVE

## 2015-09-09 LAB — URINE DRUG SCREEN, QUALITATIVE (ARMC ONLY)
AMPHETAMINES, UR SCREEN: NOT DETECTED
BENZODIAZEPINE, UR SCRN: NOT DETECTED
Barbiturates, Ur Screen: NOT DETECTED
CANNABINOID 50 NG, UR ~~LOC~~: NOT DETECTED
Cocaine Metabolite,Ur ~~LOC~~: NOT DETECTED
MDMA (Ecstasy)Ur Screen: NOT DETECTED
Methadone Scn, Ur: NOT DETECTED
OPIATE, UR SCREEN: NOT DETECTED
PHENCYCLIDINE (PCP) UR S: NOT DETECTED
Tricyclic, Ur Screen: NOT DETECTED

## 2015-09-09 LAB — ACETAMINOPHEN LEVEL: ACETAMINOPHEN (TYLENOL), SERUM: 20 ug/mL (ref 10–30)

## 2015-09-09 LAB — LIPASE, BLOOD: Lipase: 23 U/L (ref 11–51)

## 2015-09-09 LAB — SALICYLATE LEVEL: Salicylate Lvl: <4 mg/dL (ref 2.8–30.0)

## 2015-09-09 LAB — ETHANOL: Alcohol, Ethyl (B): <5 mg/dL (ref <5)

## 2015-09-09 MED ORDER — SODIUM CHLORIDE 0.9 % IV BOLUS (SEPSIS)
1000.0000 mL | Freq: Once | INTRAVENOUS | Status: AC
  Administered 2015-09-09: 1000 mL via INTRAVENOUS

## 2015-09-09 NOTE — ED Provider Notes (Signed)
[facility] Emergency Department Provider Note  ____________________________________________  Time seen: 3:50 PM  I have reviewed the triage vital signs and the nursing notes.   HISTORY  Chief Complaint Drug Overdose Patient seen with the Spanish interpreter at bedside   HPI Kaitlyn Carey is a 35 y.o. female is brought to the ED due to a intentional overdose of multiple medications.Patient was found with multiple pill bottles including Percocet, 2 bottles of promethazine, and hydroxyzine. The Percocet bottle was a prescription for 12 5/325 mg tablets. There are 6 remaining in the bottle. Patient reports that she took 5-10 pills out of each bottle. This was because she is been arguing with her daughter a lot recently and she is very upset and frustrated and states "I don't want to live like this anymore". When directly asked, she states that she was trying to kill herself when taking this overdose. Denies any history of previous attempt at self-harm. Does feel depressed. No homicidal ideation or hallucinations. Denies any pain anywhere shortness of breath nausea vomiting or diarrhea.     Past Medical History  Diagnosis Date  . Headache      There are no active problems to display for this patient.    Past Surgical History  Procedure Laterality Date  . Cholecystectomy  2011  . Cesarean section  2013     Current Outpatient Rx  Name  Route  Sig  Dispense  Refill  . guaiFENesin (MUCINEX) 600 MG 12 hr tablet   Oral   Take 1,200 mg by mouth once.         Marland Kitchen. ibuprofen (ADVIL,MOTRIN) 800 MG tablet   Oral   Take 1 tablet (800 mg total) by mouth every 8 (eight) hours as needed.   30 tablet   0   . LORazepam (ATIVAN) 1 MG tablet   Oral   Take 1 tablet (1 mg total) by mouth every 8 (eight) hours as needed for anxiety.   10 tablet   0   . EXPIRED: promethazine (PHENERGAN) 12.5 MG tablet   Oral   Take 1 tablet (12.5 mg total) by mouth every 6  (six) hours as needed for nausea or vomiting.   20 tablet   0      Allergies Review of patient's allergies indicates no known allergies.   No family history on file.  Social History Social History  Substance Use Topics  . Smoking status: Never Smoker   . Smokeless tobacco: None  . Alcohol Use: No    Review of Systems  Constitutional:   No fever or chills. No weight changes Eyes:   No blurry vision or double vision.  ENT:   No sore throat.  Cardiovascular:   No chest pain. Respiratory:   No dyspnea or cough. Gastrointestinal:   Negative for abdominal pain, vomiting and diarrhea.  No BRBPR or melena. Genitourinary:   Negative for dysuria or difficulty urinating. Musculoskeletal:   Negative for back pain. No joint swelling or pain. Skin:   Negative for rash. Neurological:   Negative for headaches, focal weakness or numbness. Psychiatric:  Depression and suicidal ideation.   Endocrine:  No changes in energy or sleep difficulty.  10-point ROS otherwise negative.  ____________________________________________   PHYSICAL EXAM:  VITAL SIGNS: ED Triage Vitals  Enc Vitals Group     BP 09/09/15 1535 95/79 mmHg     Pulse Rate 09/09/15 1535 111     Resp 09/09/15 1535 20     Temp  09/09/15 1535 98.4 F (36.9 C)     Temp Source 09/09/15 1535 Oral     SpO2 09/09/15 1535 98 %     Weight 09/09/15 1535 145 lb (65.772 kg)     Height 09/09/15 1535  (1.473 m)     Head Cir --      Peak Flow --      Pain Score 09/09/15 1606 8     Pain Loc --      Pain Edu? --      Excl. in GC? --     Vital signs reviewed, nursing assessments reviewed.   Constitutional:  Somnolent but arousable, oriented. No distress. Eyes:   No scleral icterus. No conjunctival pallor. PERRL, pupils constricted to 2 mm bilaterally. EOMI. No nystagmus ENT   Head:   Normocephalic and atraumatic.   Nose:   No congestion/rhinnorhea. No septal hematoma   Mouth/Throat:   MMM, no pharyngeal  erythema. No peritonsillar mass.    Neck:   No stridor. No SubQ emphysema. No meningismus. Hematological/Lymphatic/Immunilogical:   No cervical lymphadenopathy. Cardiovascular:   Tachycardia heart rate 110. Symmetric bilateral radial and DP pulses.  No murmurs.  Respiratory:   Normal respiratory effort without tachypnea nor retractions. Breath sounds are clear and equal bilaterally. No wheezes/rales/rhonchi. Gastrointestinal:   Soft and nontender. Non distended. There is no CVA tenderness.  No rebound, rigidity, or guarding. Normoactive bowel sounds Genitourinary:   deferred Musculoskeletal:   Nontender with normal range of motion in all extremities. No joint effusions.  No lower extremity tenderness.  No edema. Neurologic:   Slightly slurred speech.  CN 2-10 normal. Motor grossly intact.  Skin:    Skin is warm, dry and intact. No rash noted.  No petechiae, purpura, or bullae. Psychiatric:   Mood and affect are normal. ____________________________________________    LABS (pertinent positives/negatives) (all labs ordered are listed, but only abnormal results are displayed) Labs Reviewed  URINE DRUG SCREEN, QUALITATIVE ([facility] ONLY)  PREGNANCY, URINE  COMPREHENSIVE METABOLIC PANEL  ETHANOL  CBC WITH DIFFERENTIAL/PLATELET  ACETAMINOPHEN LEVEL  SALICYLATE LEVEL  LIPASE, BLOOD   ____________________________________________   EKG  Interpreted by me Normal sinus rhythm rate of 98, normal axis and intervals. Normal QRS ST segments and T waves. No conduction delay or evidence of ion channel blockade  ____________________________________________    RADIOLOGY    ____________________________________________   PROCEDURES   ____________________________________________   INITIAL IMPRESSION / ASSESSMENT AND PLAN / ED COURSE  Pertinent labs & imaging results that were available during my care of the patient were reviewed by me and considered in my medical decision making (see  chart for details).  Patient presents with ingestion of multiple medications in attempt at self-harm. Patient placed under involuntary commitment petition pending psychiatric evaluation. She does not appear to be in distress although she is intoxicated. No evidence of any severe toxidrome. Vital signs unremarkable except for tachycardia which is likely due to some anticholinergic effect as well as dehydration. We'll give IV fluids for this. We'll check a lab panel. Due to the fact that there are 6 missing Percocets, maximum total acetaminophen dose is estimated at 2000 mg. Very unlikely to produce any toxicity. No history of any additional acetaminophen or aspirin ingestion.  ----------------------------------------- 6:51 PM on 09/09/2015 -----------------------------------------  Called the lab at 6:30 PM to inquire about blood tests not being run yet. They state they have the samples and will run them now.  ----------------------------------------- 7:24 PM on 09/09/2015 ----------------------------------------- Vital signs improved.  Patient calm and comfortable. Awake. Workup negative except for Tylenol level of 20 which is well below toxic threshold. No significant alteration in LFTs. Asian is medically stable at this time to proceed with psychiatric evaluation and disposition.     ____________________________________________   FINAL CLINICAL IMPRESSION(S) / ED DIAGNOSES  Final diagnoses:  Suicide attempt by drug ingestion, initial encounter [facility]      Sharman Cheek, MD 09/09/15 1925

## 2015-09-09 NOTE — ED Notes (Signed)
Pt given meal tray and drink.

## 2015-09-09 NOTE — ED Notes (Signed)
Pt. To [facility] from ED ambulatory without difficulty, to room 1. Report from RN. Pt. Is alert and oriented, warm and dry in no distress. Pt. Denies SI, HI, and AVH. Pt. Calm and cooperative. Pt. Made aware of security cameras and Q15 minute rounds. Pt. Encouraged to let Nursing staff know of any concerns or needs. Patient is hispanic and speaks little english. This writer able to communicate in simple phrases. Patient able to communicate understanding.
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
## 2015-09-09 NOTE — ED Notes (Signed)
Original call to Poison Control. No new instructions.

## 2015-09-09 NOTE — ED Notes (Signed)
BEHAVIORAL HEALTH ROUNDING Patient sleeping: Yes.   Patient alert and oriented: not applicable Behavior appropriate: Yes.  ; If no, describe:  Nutrition and fluids offered: No Toileting and hygiene offered: No Sitter present: not applicable Law enforcement present: Yes  

## 2015-09-09 NOTE — ED Notes (Signed)
BEHAVIORAL HEALTH ROUNDING Patient sleeping: No. Patient alert and oriented: yes Behavior appropriate: Yes.  ; If no, describe:  Nutrition and fluids offered: Yes  Toileting and hygiene offered: Yes  Sitter present: not applicable Law enforcement present: Yes  

## 2015-09-09 NOTE — ED Notes (Signed)
Pt. Noted in room. No complaints or concerns voiced. No distress or abnormal behavior noted. Will continue to monitor with security cameras. Q 15 minute rounds continue. 
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
## 2015-09-09 NOTE — ED Notes (Signed)
Pt. Alert and oriented, warm and dry, in no distress. Encouraged to let nursing staff know of any concerns or needs.  

## 2015-09-09 NOTE — ED Notes (Signed)
Pt cont drowsy but coherent. Taking po without difficulty.

## 2015-09-09 NOTE — ED Notes (Signed)

## 2015-09-09 NOTE — ED Notes (Signed)
BEHAVIORAL HEALTH ROUNDING Patient sleeping: No. Patient alert and oriented: no Behavior appropriate: Yes.  ; If no, describe:  Nutrition and fluids offered: Yes  Toileting and hygiene offered: Yes  Sitter present: not applicable Law enforcement present: Yes  

## 2015-09-10 ENCOUNTER — Inpatient Hospital Stay
Admission: EM | Admit: 2015-09-10 | Discharge: 2015-09-12 | DRG: 885 | Disposition: A | Discharge status: Home / Self Care | Payer: No Typology Code available for payment source | Source: Intra-hospital | Attending: Psychiatry | Admitting: Psychiatry

## 2015-09-10 DIAGNOSIS — F331 Major depressive disorder, recurrent, moderate: Secondary | ICD-10-CM | POA: Diagnosis present

## 2015-09-10 DIAGNOSIS — G44209 Tension-type headache, unspecified, not intractable: Secondary | ICD-10-CM | POA: Diagnosis present

## 2015-09-10 DIAGNOSIS — F4321 Adjustment disorder with depressed mood: Secondary | ICD-10-CM | POA: Diagnosis present

## 2015-09-10 DIAGNOSIS — T50901A Poisoning by unspecified drugs, medicaments and biological substances, accidental (unintentional), initial encounter: Secondary | ICD-10-CM | POA: Diagnosis present

## 2015-09-10 DIAGNOSIS — F332 Major depressive disorder, recurrent severe without psychotic features: Secondary | ICD-10-CM

## 2015-09-10 DIAGNOSIS — Z9049 Acquired absence of other specified parts of digestive tract: Secondary | ICD-10-CM

## 2015-09-10 DIAGNOSIS — F4541 Pain disorder exclusively related to psychological factors: Secondary | ICD-10-CM

## 2015-09-10 DIAGNOSIS — G4700 Insomnia, unspecified: Secondary | ICD-10-CM | POA: Diagnosis present

## 2015-09-10 MED ORDER — MAGNESIUM HYDROXIDE 400 MG/5ML PO SUSP
30.0000 mL | Freq: Every day | ORAL | Status: DC | PRN
Start: 2015-09-10 — End: 2015-09-12

## 2015-09-10 MED ORDER — ALUM & MAG HYDROXIDE-SIMETH 200-200-20 MG/5ML PO SUSP: 30.0000 mL | ORAL | Status: DC | PRN

## 2015-09-10 MED ORDER — CITALOPRAM HYDROBROMIDE 20 MG PO TABS
20.0000 mg | ORAL_TABLET | Freq: Every day | ORAL | Status: DC
  Administered 2015-09-10 – 2015-09-11 (×2): 20 mg via ORAL
  Filled 2015-09-10 (×2): qty 1

## 2015-09-10 MED ORDER — ACETAMINOPHEN 325 MG PO TABS: 650.0000 mg | ORAL_TABLET | Freq: Four times a day (QID) | ORAL | Status: DC | PRN

## 2015-09-10 NOTE — ED Notes (Signed)
Pt. Noted in room. No complaints or concerns voiced. No distress or abnormal behavior noted. Will continue to monitor with security cameras. Q 15 minute rounds continue. 

## 2015-09-10 NOTE — ED Notes (Signed)
Repeat order of tylenol level reported to nurse on [facility]

## 2015-09-10 NOTE — Progress Notes (Signed)
35 yrs old female admitted with depression.Skin assessment & body search done.No contraband found.Patient denies suicidal ideations.Cooperative with admission.Oriented patient to unit & made comfortable in bed.

## 2015-09-10 NOTE — Tx Team (Signed)
Initial Interdisciplinary Treatment Plan   PATIENT STRESSORS: Marital or family conflict Medication change or noncompliance   PATIENT STRENGTHS: Average or above average intelligence Capable of independent living Supportive family/friends Work skills   PROBLEM LIST: Problem List/Patient Goals Date to be addressed Date deferred Reason deferred Estimated date of resolution  Depression 09/10/2015     Suicidal ideations 09/10/2015                                                DISCHARGE CRITERIA:  Adequate post-discharge living arrangements Improved stabilization in mood, thinking, and/or behavior Verbal commitment to aftercare and medication compliance  PRELIMINARY DISCHARGE PLAN: Return to previous living arrangement Return to previous work or school arrangements  PATIENT/FAMIILY INVOLVEMENT: This treatment plan has been presented to and reviewed with the patient, Kaitlyn Carey, and/or family member,   The patient and family have been given the opportunity to ask questions and make suggestions.  Kaitlyn Carey 09/10/2015, 5:03 PM

## 2015-09-10 NOTE — ED Provider Notes (Signed)
-----------------------------------------   7:04 AM on 09/10/2015 -----------------------------------------   Blood pressure 117/80, pulse 78, temperature 98.4 F (36.9 C), temperature source Oral, resp. rate 16, height 4\' 10"  (1.473 m), weight 145 lb (65.772 kg), SpO2 100 %.  The patient had no acute events since last update.  Calm and cooperative at this time.  Disposition is pending per Psychiatry/Behavioral Medicine team recommendations.     Sharyn CreamerMark Quale, MD 09/10/15 [phone]

## 2015-09-10 NOTE — [facility] Group Notes (Signed)
[facility] LCSW Group Therapy   09/10/2015 1pm  Type of Therapy: Group Therapy   Participation Level: Did Not Attend. Patient invited to participate but declined.    Justeen Hehr F. Kymoni Monday, MSW, LCSWA, LCAS   

## 2015-09-10 NOTE — Consult Note (Signed)
[facility] Consult   Reason for Consult:  Consult for this 35 year old woman who was brought into the hospital after an overdose with suicidal ideation Referring Physician:  Quale Patient Identification: Kaitlyn Carey MRN:  [id number] Principal Diagnosis: Severe recurrent major depression without psychotic features [facility]) Diagnosis:   Patient Active Problem List   Diagnosis Date Noted  . Severe recurrent major depression without psychotic features ([place]) [F33.2] 09/10/2015  . Suicidal ideation [R45.851] 09/10/2015  . Overdose [T50.901A] 09/10/2015    Total Time spent with patient: 1 hour  Subjective:   Kaitlyn Carey is a 35 y.o. female patient admitted with "I took a lot of pills".  HPI:  Patient interviewed with the assistance of hospital based Spanish language interpreter. Chart reviewed. Intake notes reviewed. Labs and vitals reviewed. 35 year old woman who was brought to the hospital last night by family calling 911 after she took an overdose of multiple pills. She said that this happened yesterday afternoon. She says she took about 20 pills she doesn't know what all they were some of them probably were for a headache. She admits that she was having thoughts of wishing she would die or just not wake up when she took the pills. She didn't tell anyone right away but later on when her husband came home she told him that she was feeling sick and he called an ambulance. Patient says her mood is been down and depressed for quite a while probably at least a few weeks. Sleep is not very good at night. Appetite is a little decreased. She cries frequently. Not functioning very well at home taking care of her family. She blames a lot of her stress on conflict with her adolescent daughter. She has a 28 year old daughter who is skipping school and hanging around with a boyfriend that the family doesn't approve of. This was the acute thing that happened today before yesterday that  drove her to feeling really suicidal was a fight with this daughter. Patient denies any homicidal ideation. Denies any psychotic symptoms. Denies that she's been drinking denies any drug abuse. Says her relationship with her husband is good.  Social history: Patient lives with her husband and has 4 children at home the 19 year old and 3 others were younger. 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 Her adolescent daughter is giving her the most stress however.  Medical history: Denies any significant ongoing medical problems no history of heart disease diabetes etc.  Substance abuse history: Patient says she drinks no alcohol at all and denies any use of any drugs and denies any past substance abuse problems.  Past Psychiatric History: Patient says she has been treated for depression in the past but not in quite a while probably years. She has been prescribed medicine for depression in the past but has not been seeing anyone locally and doesn't have any local psychiatric contact. Doesn't remember what medicine she took in the past. She did say that it was helpful. Denies ever being admitted to a psychiatric hospital or having tried to kill her self in the past.  Risk to Self: Suicidal Ideation: No Suicidal Intent: No Is patient at risk for suicide?: No Suicidal Plan?: No Access to Means: No What has been your use of drugs/alcohol within the last 12 months?: denied use of substances How many times?: 0 Other Self Harm Risks: denied Triggers for Past Attempts: None known Intentional Self Injurious Behavior: None Risk to  Others: Homicidal Ideation: No Thoughts of Harm to Others: No Current Homicidal Intent: No Current Homicidal Plan: No Access to Homicidal Means: No Identified Victim: denied History of harm to others?: No Assessment of Violence: None Noted Violent Behavior Description: denied Does patient have access to weapons?: No Criminal Charges  Pending?: No Does patient have a court date: No Prior Inpatient Therapy: Prior Inpatient Therapy: No Prior Therapy Dates: n/a Prior Therapy Facilty/Provider(s): n/a Reason for Treatment: n/a Prior Outpatient Therapy: Prior Outpatient Therapy: No Prior Therapy Dates: n/a Prior Therapy Facilty/Provider(s): n/a Reason for Treatment: n/a Does patient have an ACCT team?: No Does patient have Intensive In-House Services?  : No Does patient have [facility] services? : No Does patient have P4CC services?: No  Past Medical History:  Past Medical History  Diagnosis Date  . Headache     Past Surgical History  Procedure Laterality Date  . Cholecystectomy  2011  . Cesarean section  2013   Family History: No family history on file. Family Psychiatric  History: Patient denies there being any family history of mental illness or substance abuse problems. Social History:  History  Alcohol Use No     History  Drug Use Not on file    Social History   Social History  . Marital Status: Single    Spouse Name: N/A  . Number of Children: N/A  . Years of Education: N/A   Social History Main Topics  . Smoking status: Never Smoker   . Smokeless tobacco: None  . Alcohol Use: No  . Drug Use: None  . Sexual Activity: Not Asked   Other Topics Concern  . None   Social History Narrative   Additional Social History:    Allergies:  No Known Allergies  Labs:  Results for orders placed or performed during the hospital encounter of 09/09/15 (from the past 48 hour(s))  Comprehensive metabolic panel     Status: Abnormal   Collection Time: 09/09/15  3:24 PM  Result Value Ref Range   Sodium 140 135 - 145 mmol/L   Potassium 3.5 3.5 - 5.1 mmol/L   Chloride 108 101 - 111 mmol/L   CO2 25 22 - 32 mmol/L   Glucose, Bld 87 65 - 99 mg/dL   BUN 12 6 - 20 mg/dL   Creatinine, Ser 0.57 0.44 - 1.00 mg/dL   Calcium 9.0 8.9 - 10.3 mg/dL   Total Protein 7.3 6.5 - 8.1 g/dL   Albumin 4.2 3.5 - 5.0 g/dL    AST 47 (H) 15 - 41 U/L   ALT 65 (H) 14 - 54 U/L   Alkaline Phosphatase 72 38 - 126 U/L   Total Bilirubin 0.8 0.3 - 1.2 mg/dL   GFR calc non Af Amer >60 >60 mL/min   GFR calc Af Amer >60 >60 mL/min    Comment: (NOTE) The eGFR has been calculated using the CKD EPI equation. This calculation has not been validated in all clinical situations. eGFR's persistently <60 mL/min signify possible Chronic Kidney Disease.    Anion gap 7 5 - 15  Ethanol     Status: None   Collection Time: 09/09/15  3:24 PM  Result Value Ref Range   Alcohol, Ethyl (B) <5 <5 mg/dL    Comment:        LOWEST DETECTABLE LIMIT FOR SERUM ALCOHOL IS 5 mg/dL FOR MEDICAL PURPOSES ONLY   CBC with Diff     Status: Abnormal   Collection Time: 09/09/15  3:24 PM  Result  Value Ref Range   WBC 7.4 3.6 - 11.0 K/uL   RBC 4.67 3.80 - 5.20 MIL/uL   Hemoglobin 14.5 12.0 - 16.0 g/dL   HCT 42.3 35.0 - 47.0 %   MCV 90.5 80.0 - 100.0 fL   MCH 31.1 26.0 - 34.0 pg   MCHC 34.3 32.0 - 36.0 g/dL   RDW 13.6 11.5 - 14.5 %   Platelets 147 (L) 150 - 440 K/uL   Neutrophils Relative % 54 %   Neutro Abs 4.0 1.4 - 6.5 K/uL   Lymphocytes Relative 33 %   Lymphs Abs 2.4 1.0 - 3.6 K/uL   Monocytes Relative 7 %   Monocytes Absolute 0.5 0.2 - 0.9 K/uL   Eosinophils Relative 6 %   Eosinophils Absolute 0.5 0 - 0.7 K/uL   Basophils Relative 0 %   Basophils Absolute 0.0 0 - 0.1 K/uL  Acetaminophen level     Status: None   Collection Time: 09/09/15  3:24 PM  Result Value Ref Range   Acetaminophen (Tylenol), Serum 20 10 - 30 ug/mL    Comment:        THERAPEUTIC CONCENTRATIONS VARY SIGNIFICANTLY. A RANGE OF 10-30 ug/mL MAY BE AN EFFECTIVE CONCENTRATION FOR MANY PATIENTS. HOWEVER, SOME ARE BEST TREATED AT CONCENTRATIONS OUTSIDE THIS RANGE. ACETAMINOPHEN CONCENTRATIONS >150 ug/mL AT 4 HOURS AFTER INGESTION AND >50 ug/mL AT 12 HOURS AFTER INGESTION ARE OFTEN ASSOCIATED WITH TOXIC REACTIONS.   Salicylate level     Status: None    Collection Time: 09/09/15  3:24 PM  Result Value Ref Range   Salicylate Lvl <8.5 2.8 - 30.0 mg/dL  Lipase, blood     Status: None   Collection Time: 09/09/15  3:24 PM  Result Value Ref Range   Lipase 23 11 - 51 U/L  Urine Drug Screen, Qualitative ([facility] only)     Status: None   Collection Time: 09/09/15  4:10 PM  Result Value Ref Range   Tricyclic, Ur Screen NONE DETECTED NONE DETECTED   Amphetamines, Ur Screen NONE DETECTED NONE DETECTED   MDMA (Ecstasy)Ur Screen NONE DETECTED NONE DETECTED   Cocaine Metabolite,Ur [place] NONE DETECTED NONE DETECTED   Opiate, Ur Screen NONE DETECTED NONE DETECTED   Phencyclidine (PCP) Ur S NONE DETECTED NONE DETECTED   Cannabinoid 50 Ng, Ur [place] NONE DETECTED NONE DETECTED   Barbiturates, Ur Screen NONE DETECTED NONE DETECTED   Benzodiazepine, Ur Scrn NONE DETECTED NONE DETECTED   Methadone Scn, Ur NONE DETECTED NONE DETECTED    Comment: (NOTE) 277  Tricyclics, urine               Cutoff 1000 ng/mL 200  Amphetamines, urine             Cutoff 1000 ng/mL 300  MDMA (Ecstasy), urine           Cutoff 500 ng/mL 400  Cocaine Metabolite, urine       Cutoff 300 ng/mL 500  Opiate, urine                   Cutoff 300 ng/mL 600  Phencyclidine (PCP), urine      Cutoff 25 ng/mL 700  Cannabinoid, urine              Cutoff 50 ng/mL 800  Barbiturates, urine             Cutoff 200 ng/mL 900  Benzodiazepine, urine           Cutoff 200 ng/mL 1000 Methadone,  urine                Cutoff 300 ng/mL 1100 1200 The urine drug screen provides only a preliminary, unconfirmed 1300 analytical test result and should not be used for non-medical 1400 purposes. Clinical consideration and professional judgment should 1500 be applied to any positive drug screen result due to possible 1600 interfering substances. A more specific alternate chemical method 1700 must be used in order to obtain a confirmed analytical result.  1800 Gas chromato graphy / mass spectrometry (GC/MS) is the  preferred 1900 confirmatory method.   Pregnancy, urine     Status: None   Collection Time: 09/09/15  4:30 PM  Result Value Ref Range   Preg Test, Ur NEGATIVE NEGATIVE    No current facility-administered medications for this encounter.   Current Outpatient Prescriptions  Medication Sig Dispense Refill  . guaiFENesin (MUCINEX) 600 MG 12 hr tablet Take 1,200 mg by mouth once.    Marland Kitchen ibuprofen (ADVIL,MOTRIN) 800 MG tablet Take 1 tablet (800 mg total) by mouth every 8 (eight) hours as needed. 30 tablet 0  . LORazepam (ATIVAN) 1 MG tablet Take 1 tablet (1 mg total) by mouth every 8 (eight) hours as needed for anxiety. 10 tablet 0  . promethazine (PHENERGAN) 12.5 MG tablet Take 1 tablet (12.5 mg total) by mouth every 6 (six) hours as needed for nausea or vomiting. 20 tablet 0    Musculoskeletal: Strength & Muscle Tone: within normal limits Gait & Station: normal Patient leans: N/A  Psychiatric Specialty Exam: Review of Systems  Constitutional: Negative.   HENT: Negative.   Eyes: Negative.   Respiratory: Negative.   Cardiovascular: Negative.   Gastrointestinal: Negative.   Musculoskeletal: Negative.   Skin: Negative.   Neurological: Negative.   Psychiatric/Behavioral: Positive for depression and suicidal ideas. Negative for hallucinations, memory loss and substance abuse. The patient is nervous/anxious and has insomnia.     Blood pressure 117/80, pulse 78, temperature 98.4 F (36.9 C), temperature source Oral, resp. rate 16, height _0  (1.473 m), weight 65.772 kg (145 lb), SpO2 100 %.Body mass index is 30.31 kg/(m^2).  General Appearance: Disheveled  Eye Contact::  Minimal  Speech:  Slow and Slurred  Volume:  Decreased  Mood:  Depressed  Affect:  Depressed and Tearful  Thought Process:  Goal Directed  Orientation:  Full (Time, Place, and Person)  Thought Content:  Negative  Suicidal Thoughts:  Yes.  with intent/plan  Homicidal Thoughts:  No  Memory:  Immediate;    Fair Recent;   Fair Remote;   Fair  Judgement:  Fair  Insight:  Fair  Psychomotor Activity:  Decreased  Concentration:  Fair  Recall:  [employer] of Knowledge:Fair  Language: Fair  Akathisia:  No  Handed:  Right  AIMS (if indicated):     Assets:  Communication Skills Desire for Improvement Financial Resources/Insurance Housing Physical Health Resilience  ADL's:  Intact  Cognition: WNL  Sleep:      Treatment Plan Summary: Daily contact with patient to assess and evaluate symptoms and progress in treatment, Medication management and Plan 35 year old woman who has a past history of depression as well who took an overdose with actual suicide intent. Didn't immediately get help although she has been cooperative since coming into the hospital. Patient continues to be tearful and dysphoric and depressed and feel overwhelmed. She does not have any outpatient treatment in place. I think the appropriate plan his admission to the psychiatry unit. Orders completed.  One hesitation I have is that we don't know for certain what pills she took. The labs were done on admission and showed a acetaminophen level of 20 which is nowhere near being toxic but is also not low enough to indicate that no Tylenol was involved. There isn't a follow-up level yet and I'm going to order another one be done stat right now. Her liver function is normal however and there doesn't seem to be any other sign of any damage or medical illness. Orders done for admission. Continue observation 15 minutes on the unit. Patient will have daily individual and group psychotherapy for depression. I will initiate citalopram 20 mg a day for depression. Continue monitoring for suicidal ideation. Has been informed of the plan and is agreeable.  Disposition: Recommend psychiatric Inpatient admission when medically cleared. Supportive therapy provided about ongoing stressors.  Alethia Berthold, MD 09/10/2015 1:20 PM

## 2015-09-10 NOTE — ED Notes (Signed)
Pt to be adm to [facility]

## 2015-09-11 ENCOUNTER — Encounter: Payer: Self-pay | Admitting: Psychiatry

## 2015-09-11 DIAGNOSIS — F4541 Pain disorder exclusively related to psychological factors: Secondary | ICD-10-CM

## 2015-09-11 DIAGNOSIS — F4321 Adjustment disorder with depressed mood: Secondary | ICD-10-CM

## 2015-09-11 MED ORDER — CLONAZEPAM 0.5 MG PO TABS
0.2500 mg | ORAL_TABLET | Freq: Three times a day (TID) | ORAL | Status: DC
  Administered 2015-09-11 – 2015-09-12 (×4): 0.25 mg via ORAL
  Filled 2015-09-11 (×4): qty 1

## 2015-09-11 MED ORDER — CLONAZEPAM 0.5 MG PO TABS: 0.2500 mg | ORAL_TABLET | Freq: Three times a day (TID) | ORAL | Status: AC | PRN

## 2015-09-11 MED ORDER — TRAZODONE HCL 100 MG PO TABS: 100.0000 mg | ORAL_TABLET | Freq: Every day | ORAL | Status: AC

## 2015-09-11 MED ORDER — TRAZODONE HCL 50 MG PO TABS
75.0000 mg | ORAL_TABLET | Freq: Every day | ORAL | Status: DC
  Administered 2015-09-11: 75 mg via ORAL
  Filled 2015-09-11: qty 2
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
## 2015-09-11 NOTE — Progress Notes (Signed)
[facility]. Pt again denied SI/HI/AVH/pain. Interpreter said pt did not eat well at lunch because she did not like the salad. Will continue to monitor for needs/safety.

## 2015-09-11 NOTE — [facility] Group Notes (Signed)
[facility] Group Notes:  (Nursing/MHT/Case Management/Adjunct)  Date:  09/11/2015  Time:  10:24 PM  Type of Therapy:  Group Therapy  Participation Level:  Did Not Attend    Summary of Progress/Problems:  Kaitlyn Carey 09/11/2015, 10:24 PM

## 2015-09-11 NOTE — Progress Notes (Signed)
Recreation Therapy Notes  Date: 03.15.17 Time: 3:00 pm Location: Craft Room  Group Topic: Self-esteem  Goal Area(s) Addresses:  Patient will write at least one positive trait. Patient will verbalize benefit of having a healthy self-esteem.  Behavioral Response: Did not attend  Intervention: I Am  Activity: Patients were given a worksheet with the letter I on it and instructed to write as many positive traits inside the letter.  Education: LRT educated patients on ways they can increase their self-esteem.  Education Outcome: Patient did not attend group.   Clinical Observations/Feedback: Patient did not attend group.  Jacquelynn CreeGreene,Maui Britten M, LRT/CTRS 09/11/2015 4:30 PM

## 2015-09-11 NOTE — B[facility] Counselor (Signed)
Adult Comprehensive Assessment  Patient ID: Kaitlyn Carey, female   DOB: [birth year], 35 y.o.   MRN: [id number]  Information Source: Information source: Interpreter  Current Stressors:  Family Relationships: trouble with my daughter, heated discussion, she pounced on me, so I did not sleep, I woke up with big headache, felt sad and deceived  Living/Environment/Situation:  Living Arrangements: Spouse/significant other, Children How long has patient lived in current situation?: 2484yrs What is atmosphere in current home: Comfortable  Family History:  Marital status: Married Number of Years Married: 15 What types of issues is patient dealing with in the relationship?: denies Does patient have children?: Yes How many children?: 4 How is patient's relationship with their children?: conflict with 37 yo daughter  Childhood History:  By whom was/is the patient raised?: Both parents Description of patient's relationship with caregiver when they were a child: good relationship as a child Patient's description of current relationship with people who raised him/her: little stubborn but I did listen to what they said, no similarity to how we are now Does patient have siblings?: Yes Number of Siblings: 2 Description of patient's current relationship with siblings: good relationship with brothers, one is older and one is younger Did patient suffer any verbal/emotional/physical/sexual abuse as a child?: No Did patient suffer from severe childhood neglect?: No Has patient ever been sexually abused/assaulted/raped as an adolescent or adult?: No Was the patient ever a victim of a crime or a disaster?: No Witnessed domestic violence?: No Has patient been effected by domestic violence as an adult?: No  Education:  Highest grade of school patient has completed: 6th Currently a Consulting civil engineerstudent?: No Learning disability?: No  Employment/Work Situation:   Employment situation: Employed 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, 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 How long has patient been employed?: 1 year Has patient ever been in the [employer] and Companymilitary?: No Has patient ever served in combat?: No Did You Receive Any Psychiatric Treatment/Services While in Equities traderthe Military?: No Are There Guns or Other Weapons in Your Home?: No Are These ComptrollerWeapons Safely Secured?:  (n/a)  Financial Resources:   Financial resources: Income from employment, Income from spouse, Food stamps Does patient have a representative payee or guardian?: No  Alcohol/Substance Abuse:   What has been your use of drugs/alcohol within the last 12 months?: patient denies Alcohol/Substance Abuse Treatment Hx: Denies past history Has alcohol/substance abuse ever caused legal problems?: No  Social Support System:   Museum/gallery exhibitions officerDescribe Community Support System: husband, mom Type of faith/religion: denies  Leisure/Recreation:   Leisure and Hobbies: cook  Strengths/Needs:   What things does the patient do well?: whatever I do turns out well In what areas does patient struggle / problems for patient: denies  Discharge Plan:   Does patient have access to transportation?: Yes (I'll call someone because my husband will be working) Will patient be returning to same living situation after discharge?: Yes Currently receiving community mental health services: No If no, would patient like referral for services when discharged?: Yes (What [place]?) ([facility] [place]) Does patient have financial barriers related to discharge medications?: No Patient description of barriers related to discharge medications: Med Associate ProfessorMngt Application  Summary/Recommendations:   Summary and Recommendations (to be completed by the evaluator): Patient is a 35 year old Hispanic female admitted with a diagnosis of Adjustment disorder with depressed mood. Patient presented to the hopital with headaches after not being able to sleep due to stress from home and a fight with her daughter. Patient reports she lives at home  with her husband  Kaitlyn Carey [phone] and her 4 daughters. Patient will follow up with [facility] [phone]) in [facility] for outpatient mental health services and return home once stabilized on medications. Patient is uninsured and will need interpreter services. Patient will benefit from crisis stabilization, medication evaluation, group therapy and psycho education in addition to  case management for dischaerge planning. At discharge, it is recommended that patient remain compliant with established discharge plan and continued treatment.  Kaitlyn Meager T., MSW, Theresia Majors  09/11/2015  [phone]

## 2015-09-11 NOTE — [facility] Group Notes (Signed)
[facility] LCSW Aftercare Discharge Planning Group Note  09/11/2015 9:30 AM  Participation Quality: Did Not Attend. Patient invited to participate but declined.   Magie Ciampa F. Koi Yarbro, MSW, LCSWA, LCAS   

## 2015-09-11 NOTE — [facility] Suicide Risk Assessment (Signed)
[facility]   Nursing information obtained from:  Patient Demographic factors:  NA Current Mental Status:  Self-harm behaviors Loss Factors:  NA Historical Factors:  NA Risk Reduction Factors:  Living with another person, especially a relative  Total Time spent with patient: 1 hour Principal Problem: Adjustment disorder with depressed mood Diagnosis:   Patient Active Problem List   Diagnosis Date Noted  . Adjustment disorder with depressed mood [F43.21] 09/11/2015  . Stress headaches [F45.41] 09/11/2015   Subjective Data:   Continued Clinical Symptoms:  Alcohol Use Disorder Identification Test Final Score (AUDIT): 0 The "Alcohol Use Disorders Identification Test", Guidelines for Use in Primary Care, Second Edition.  World Science writerHealth Organization [facility](WHO). Score between 0-7:  no or low risk or alcohol related problems. Score between 8-15:  moderate risk of alcohol related problems. Score between 16-19:  high risk of alcohol related problems. Score 20 or above:  warrants further diagnostic evaluation for alcohol dependence and treatment.   CLINICAL FACTORS:   Depression:   Impulsivity Previous Psychiatric Diagnoses and Treatments   Psychiatric Specialty Exam: ROS                                                          COGNITIVE FEATURES THAT CONTRIBUTE TO RISK:  None    SUICIDE RISK:   Mild:  Suicidal ideation of limited frequency, intensity, duration, and specificity.  There are no identifiable plans, no associated intent, mild dysphoria and related symptoms, good self-control (both objective and subjective assessment), few other risk factors, and identifiable protective factors, including available and accessible social support.  [facility] CARE[facility]  I certify that inpatient services furnished can reasonably be expected to improve the patient's condition.   Jimmy FootmanHernandez-Gonzalez,  Ahlaya Ende, MD 09/11/2015, 9:55 AM

## 2015-09-11 NOTE — Plan of Care (Signed)
Problem: Alteration in mood Goal: LTG-Patient reports reduction in suicidal thoughts (Patient reports reduction in suicidal thoughts and is able to verbalize a safety plan for whenever patient is feeling suicidal) Outcome: Progressing She denied SI.

## 2015-09-11 NOTE — Progress Notes (Signed)
D: Kaitlyn Carey was able to answer needed questions without aid of an interpreter. She denied SI/HI/AVH. She denied SI/HI/AVH and admitted some anxiety. She was compliant with medication and verbalized no further needs when given an opportunity and encouraged to do so. A: Meds given as ordered. Q15 safety checks maintained. Support/encouragement offered. R: Pt remains free from harm and continues with treatment. Will continue to monitor for needs/safety.

## 2015-09-11 NOTE — [facility] Group Notes (Signed)
[facility] Group Notes:  (Nursing/MHT/Case Management/Adjunct)  Date:  09/11/2015  Time:  3:10 PM  Type of Therapy:  Psychoeducational Skills  Participation Level:  Did Not Attend   Lynelle SmokeCara Travis [facility] HospitalMadoni 09/11/2015, 3:10 PM

## 2015-09-11 NOTE — Progress Notes (Signed)
D: Pt is alert and oriented x 3 with periods of confusion to situation. Patient speaks fluent Spanish and limited English and the nurse is able to communicate with her about her treatment plan. Pt. is pleasant and cooperative, denies SI/HI appears less anxious,however she isolates to her room and is not interacting with peers.  Pt was offered support and encouragement. Pt was offered PRN medications. Pt was encouraged to attend groups. Q 15 minute checks were done for safety.  R:Pt did not attend evening group.Pt is refused PRN medication. Pt has no complaints.Pt receptive to treatment and safety maintained on unit.

## 2015-09-11 NOTE — [facility] Group Notes (Signed)
[facility] LCSW Group Therapy   09/11/2015 1pm  Type of Therapy: Group Therapy   Participation Level: Did Not Attend. Patient invited to participate but declined.    Deyonna Fitzsimmons F. Elina Streng, MSW, LCSWA, LCAS   

## 2015-09-12 ENCOUNTER — Encounter: Payer: Self-pay | Admitting: Psychiatry

## 2015-09-12 DIAGNOSIS — F331 Major depressive disorder, recurrent, moderate: Secondary | ICD-10-CM

## 2015-09-12 MED ORDER — CITALOPRAM HYDROBROMIDE 10 MG PO TABS: 10.0000 mg | ORAL_TABLET | Freq: Every day | ORAL | Status: AC

## 2015-09-12 NOTE — B[facility] Suicide Risk Assessment (Signed)
B[facility] INPATIENT:  Family/Significant Other Suicide Prevention Education  Suicide Prevention Education:  Education Completed; Kaitlyn Carey (husband) [phone] has been identified by the patient as the family member/significant other with whom the patient will be residing, and identified as the person(s) who will aid the patient in the event of a mental health crisis (suicidal ideations/suicide attempt).  With written consent from the patient, the family member/significant other has been provided through the use of Spanish interpreter the following suicide prevention education, prior to the and/or following the discharge of the patient.  The suicide prevention education provided includes the following:  Suicide risk factors  Suicide prevention and interventions  National Suicide Hotline telephone number  [facility] assessment telephone number  [facility] Emergency Assistance 911  [facility] and/or Residential Mobile Crisis Unit telephone number  Request made of family/significant other to:  Remove weapons (e.g., guns, rifles, knives), all items previously/currently identified as safety concern.    Remove drugs/medications (over-the-counter, prescriptions, illicit drugs), all items previously/currently identified as a safety concern.  The family member/significant other verbalizes understanding of the suicide prevention education information provided.  The family member/significant other agrees to remove the items of safety concern listed above.  Kaitlyn Carey, Kaitlyn Carey, MSW, LCSWA  09/12/2015, 11:33 AM

## 2015-09-12 NOTE — [facility] Suicide Risk Assessment (Signed)
[facility]   Principal Problem: Adjustment disorder with depressed mood Discharge Diagnoses:  Patient Active Problem List   Diagnosis Date Noted  . Adjustment disorder with depressed mood [F43.21] 09/11/2015  . Stress headaches [F45.41] 09/11/2015     Psychiatric Specialty Exam: ROS  Blood pressure 109/83, pulse 84, temperature 99.1 F (37.3 C), temperature source Oral, resp. rate 20, height 4\' 5"  (1.346 m), weight 68.947 kg (152 lb), SpO2 100 %.Body mass index is 38.06 kg/(m^2).                                                       Mental Status Per Nursing Assessment::   On Admission:  Self-harm behaviors  Demographic Factors:  married, employed, has children under age 37.    Loss Factors: relational problems with daughter  Historical Factors: Impulsivity  Risk Reduction Factors:   Responsible for children under 37 years of age, Sense of responsibility to family, Religious beliefs about death, Employed, Living with another person, especially a relative and Positive social support  Continued Clinical Symptoms:  None  Cognitive Features That Contribute To Risk:  None    Suicide Risk:  Minimal: No identifiable suicidal ideation.  Patients presenting with no risk factors but with morbid ruminations; may be classified as minimal risk based on the severity of the depressive symptoms  Follow-up Information    [facility] up with [facility]. Go on 09/16/2015.   Why:  For follow-up care appt Monday 09/16/15 at 8:00am (walk in hours are MWF 8-3)   Contact information:   [address]  [place], [place] Ph [phone] Fax [phone]      Jimmy FootmanHernandez-Gonzalez,  Jaretssi Kraker, MD 09/12/2015, 10:01 AM

## 2015-09-12 NOTE — Progress Notes (Signed)
  [facility] Adult Case Management Discharge Plan :  Will you be returning to the same living situation after discharge:  Yes,  home with husband and family At discharge, do you have transportation home?: Yes,  husband will pick up Do you have the ability to pay for your medications: Yes,  patient medications are inexpensive but is also given a Medication Manamgent Application  Release of information consent forms completed and in the chart;  Patient's signature needed at discharge.  Patient to Follow up at: Follow-up Information    Follow up with [facility]. Go on 09/16/2015.   Why:  For follow-up care appt Monday 09/16/15 at 8:00am (walk in hours are MWF 8-3)   Contact information:   [address]  [place], [place] Ph [phone] Fax [phone]      Next level of care provider has access to [facility] Link:no  Safety Planning and Suicide Prevention discussed: Yes,  SPE discussed with patient and Waymond CeraLouis Dario Haimenez (husband) [phone] with Spanish interpreter per MD   Have you used any form of tobacco in the last 30 days? (Cigarettes, Smokeless Tobacco, Cigars, and/or Pipes): No  Has patient been referred to the Quitline?: N/A patient is not a smoker  Patient has been referred for addiction treatment: N/A  Lulu RidingIngle, Jamorion Gomillion T, MSW, LCSWA 09/12/2015, 11:41 AM  [phone]

## 2015-09-12 NOTE — Tx Team (Signed)
Interdisciplinary Treatment Plan Update (Adult)  Date:  09/12/2015 Time Reviewed:  11:35 AM  Progress in Treatment: Attending groups: No. Participating in groups:  No. Taking medication as prescribed:  Yes. Tolerating medication:  Yes. Family/Significant othe contact made:  Yes, individual(s) contacted:  patient's husband with Spanish interpretation per MD Patient understands diagnosis:  Yes. Discussing patient identified problems/goals with staff:  Yes. Medical problems stabilized or resolved:  Yes. Denies suicidal/homicidal ideation: Yes. Issues/concerns per patient self-inventory:  Yes. Other:  New problem(s) identified: No, Describe:  none reported  Discharge Plan or Barriers: Patient stabilized and will discharge home with husband and children with outpatient follow up in [facility]  Reason for Continuation of Hospitalization: Anxiety Depression Suicidal ideation  Comments:  Estimated length of stay: 0 days, will discharge today Thursday 09/12/15  New goal(s):  Review of initial/current patient goals per problem list:   1.  Goal(s): Participate in aftercare plan   Met:  Yes  Target date: by discharge  As evidenced by: patient will participate in aftercare plan AEB aftercare provider and housing plan identified at discharge  09/12/15: Patient will discharge home with family and follow up with [facility] for outpatient aftercare  2.  Goal (s): Decrease depression   Met:  Yes  Target date: by discharge  As evidenced by: patient demonstrates decreased symptoms of depression and reports a Depression rating of 3 or less  09/12/15: Patient stable for discharge per MD. Goal met.  3.  Goal(s): Decrease anxiety   Met:  Yes  Target date: by discharge  As evidenced by: patient demonstrates decreased symptoms of anxiety and reports an Anxiety rating of 3 or less 09/12/15: Patient stable for discharge per MD. Goal met.   Attendees: Physician:  Merlyn Albert, MD  3/16/201711:35 AM  Nursing:   Tyler Pita, RN 3/16/201711:35 AM  Other:  Carmell Austria, [place] 3/16/201711:35 AM  Other:   3/16/201711:35 AM  Other:   3/16/201711:35 AM  Other:  3/16/201711:35 AM  Other:  3/16/201711:35 AM  Other:  3/16/201711:35 AM  Other:  3/16/201711:35 AM  Other:  3/16/201711:35 AM  Other:  3/16/201711:35 AM  Other:   3/16/201711:35 AM   Scribe for Treatment Team:   Keene Breath, MSW, [place]  09/12/2015, 11:35 AM

## 2015-09-12 NOTE — Progress Notes (Signed)
Patient denies SI/HI, denies A/V hallucinations. Patient verbalizes understanding of discharge instructions, follow up care and prescriptions. Patient given all belongings from  locker. Patient escorted out by staff, transported by family. 

## 2015-09-12 NOTE — Progress Notes (Signed)
D: Pt denies SI/HI/AVH. Pt is flat and sad but cooperative care. Patient appears less anxious. Patient is not interacting with peers and staff appropriately.  A: Pt was offered support and encouragement. Pt was given scheduled medications. Pt was encouraged to attend groups. Q 15 minute checks were done for safety.  R: Pt did not attend group. Pt is taking medication. Pt receptive to treatment and safety maintained on unit.

## 2015-09-12 NOTE — Plan of Care (Signed)
Problem: Alteration in mood Goal: LTG-Patient reports reduction in suicidal thoughts (Patient reports reduction in suicidal thoughts and is able to verbalize a safety plan for whenever patient is feeling suicidal)  Outcome: Progressing Patient denies SI/HI.      
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
## 2015-09-12 NOTE — [facility] Group Notes (Signed)
[facility] Group Notes:  (Nursing/MHT/Case Management/Adjunct)  Date:  09/12/2015  Time:  9:25 AM  Type of Therapy:  Community Meeting   Participation Level:  Did Not Attend  Kaitlyn Carey 09/12/2015, 9:25 AM 

## 2015-12-10 ENCOUNTER — Emergency Department
Admission: EM | Admit: 2015-12-10 | Discharge: 2015-12-11 | Disposition: A | Discharge status: Other Acute Inpatient Hospital | Payer: Self-pay | Attending: Emergency Medicine | Admitting: Emergency Medicine

## 2015-12-10 DIAGNOSIS — Z79899 Other long term (current) drug therapy: Secondary | ICD-10-CM | POA: Insufficient documentation

## 2015-12-10 DIAGNOSIS — F332 Major depressive disorder, recurrent severe without psychotic features: Secondary | ICD-10-CM | POA: Insufficient documentation

## 2015-12-10 LAB — COMPREHENSIVE METABOLIC PANEL
ALBUMIN: 5 g/dL (ref 3.5–5.0)
ALT: 69 U/L — ABNORMAL HIGH (ref 14–54)
ANION GAP: 9 (ref 5–15)
AST: 39 U/L (ref 15–41)
Alkaline Phosphatase: 89 U/L (ref 38–126)
BILIRUBIN TOTAL: 0.7 mg/dL (ref 0.3–1.2)
BUN: 10 mg/dL (ref 6–20)
CO2: 25 mmol/L (ref 22–32)
Calcium: 9.5 mg/dL (ref 8.9–10.3)
Chloride: 103 mmol/L (ref 101–111)
Creatinine, Ser: 0.69 mg/dL (ref 0.44–1.00)
GFR calc Af Amer: >60 mL/min (ref >60)
Glucose, Bld: 90 mg/dL (ref 65–99)
POTASSIUM: 3.8 mmol/L (ref 3.5–5.1)
Sodium: 137 mmol/L (ref 135–145)
TOTAL PROTEIN: 8.4 g/dL — AB (ref 6.5–8.1)

## 2015-12-10 LAB — URINE DRUG SCREEN, QUALITATIVE (ARMC ONLY)
Amphetamines, Ur Screen: NOT DETECTED
BARBITURATES, UR SCREEN: NOT DETECTED
Benzodiazepine, Ur Scrn: NOT DETECTED
CANNABINOID 50 NG, UR ~~LOC~~: NOT DETECTED
COCAINE METABOLITE, UR ~~LOC~~: NOT DETECTED
MDMA (ECSTASY) UR SCREEN: NOT DETECTED
Methadone Scn, Ur: NOT DETECTED
OPIATE, UR SCREEN: NOT DETECTED
PHENCYCLIDINE (PCP) UR S: NOT DETECTED
Tricyclic, Ur Screen: NOT DETECTED

## 2015-12-10 LAB — CBC
HEMATOCRIT: 44.6 % (ref 35.0–47.0)
Hemoglobin: 15.1 g/dL (ref 12.0–16.0)
MCH: 30.6 pg (ref 26.0–34.0)
MCHC: 33.9 g/dL (ref 32.0–36.0)
MCV: 90.1 fL (ref 80.0–100.0)
Platelets: 197 10*3/uL (ref 150–440)
RBC: 4.95 MIL/uL (ref 3.80–5.20)
RDW: 13.2 % (ref 11.5–14.5)
WBC: 11.7 10*3/uL — ABNORMAL HIGH (ref 3.6–11.0)

## 2015-12-10 LAB — URINALYSIS COMPLETE WITH MICROSCOPIC (ARMC ONLY)
Bilirubin Urine: NEGATIVE
GLUCOSE, UA: NEGATIVE mg/dL
HGB URINE DIPSTICK: NEGATIVE
Ketones, ur: NEGATIVE mg/dL
LEUKOCYTES UA: NEGATIVE
NITRITE: NEGATIVE
Protein, ur: NEGATIVE mg/dL
RBC / HPF: NONE SEEN RBC/hpf (ref 0–5)
Specific Gravity, Urine: 1.006 (ref 1.005–1.030)
pH: 6 (ref 5.0–8.0)

## 2015-12-10 LAB — POCT PREGNANCY, URINE: Preg Test, Ur: NEGATIVE

## 2015-12-10 LAB — ETHANOL: Alcohol, Ethyl (B): <5 mg/dL (ref <5)

## 2015-12-10 MED ORDER — ACETAMINOPHEN 500 MG PO TABS
1000.0000 mg | ORAL_TABLET | Freq: Once | ORAL | Status: AC
  Administered 2015-12-10: 1000 mg via ORAL

## 2015-12-10 MED ORDER — ACETAMINOPHEN 500 MG PO TABS
ORAL_TABLET | ORAL | Status: AC
  Filled 2015-12-10: qty 2

## 2015-12-10 NOTE — BHH Counselor (Addendum)
12/11/2015 6:03 am - Address for [facility], [address], [place], [place]  [id number]  [facility].  Patient has been accepted by Dr. Shawnie DapperLopez.  Call report to [phone].

## 2015-12-10 NOTE — ED Notes (Signed)
Pt reports she was referred to go to [facility]. States " the MD there thought i wanted to kill myself" but pt states she just wanted her headache to go away and not be sad. Pt reports neck pain as well as the HA. Denies SI/HI at this moment. Reports the MD misunderstood her and she did not attempt suicide before. Denies having any plan/thoughts to harm herself.

## 2015-12-10 NOTE — ED Notes (Signed)
[facility]- [phone]

## 2015-12-10 NOTE — ED Provider Notes (Signed)
[facility] Emergency Department Provider Note  ____________________________________________  Time seen: 8:10 PM  I have reviewed the triage vital signs and the nursing notes.   HISTORY  Chief Complaint Behavior Problem  Patient interviewed and examined with Spanish interpreter at bedside  HPI Kaitlyn Carey is a 35 y.o. female sent to the ED from [facility] due to severe depression and suicidal ideation. She is under involuntary commitment initiated by psychiatry Dr. Lenis Noon. A full psychiatric evaluation in progress note accompanies her chart.The patient describes sometimes being sad but that she has good coping skills and that she would never harm herself because she wants to be there to take care of her 2 children. However, the clinic note is very convincing that the patient is suffering from severe depressive symptoms. Patient denies any acute medical complaints. Denies any acute ingestions or overdose     Past Medical History  Diagnosis Date  . Headache      Patient Active Problem List   Diagnosis Date Noted  . Major depressive disorder, recurrent episode, moderate (HCC) 09/12/2015  . Stress headaches 09/11/2015     Past Surgical History  Procedure Laterality Date  . Cholecystectomy  2011  . Cesarean section  2013     Current Outpatient Rx  Name  Route  Sig  Dispense  Refill  . citalopram (CELEXA) 10 MG tablet   Oral   Take 1 tablet (10 mg total) by mouth daily.   30 tablet   0   . clonazePAM (KLONOPIN) 0.5 MG tablet   Oral   Take 0.5 tablets (0.25 mg total) by mouth 3 (three) times daily as needed for anxiety.   30 tablet   0   . traZODone (DESYREL) 100 MG tablet   Oral   Take 1 tablet (100 mg total) by mouth at bedtime.   30 tablet   0      Allergies Review of patient's allergies indicates no known allergies.   No family history on file.  Social History Social History  Substance Use Topics  . Smoking status: Never  Smoker   . Smokeless tobacco: Not on file  . Alcohol Use: No    Review of Systems  Constitutional:   No fever or chills.  Eyes:   No vision changes.  ENT:   No sore throat. No rhinorrhea. Cardiovascular:   No chest pain. Respiratory:   No dyspnea or cough. Gastrointestinal:   Negative for abdominal pain, vomiting and diarrhea.  Genitourinary:   Negative for dysuria or difficulty urinating. Musculoskeletal:   Negative for focal pain or swelling Neurological:   Negative for headaches 10-point ROS otherwise negative.  ____________________________________________   PHYSICAL EXAM:  VITAL SIGNS: ED Triage Vitals  Enc Vitals Group     BP 12/10/15 1911 141/110 mmHg     Pulse Rate 12/10/15 1911 79     Resp 12/10/15 1911 18     Temp 12/10/15 1911 98.3 F (36.8 C)     Temp Source 12/10/15 1911 Oral     SpO2 12/10/15 1911 100 %     Weight 12/10/15 1910 152 lb (68.947 kg)     Height 12/10/15 1910  (1.448 m)     Head Cir --      Peak Flow --      Pain Score 12/10/15 1910 10     Pain Loc --      Pain Edu? --      Excl. in GC? --  Vital signs reviewed, nursing assessments reviewed.   Constitutional:   Alert and oriented. Well appearing and in no distress. Eyes:   No scleral icterus. No conjunctival pallor. PERRL. EOMI.  No nystagmus. ENT   Head:   Normocephalic and atraumatic.   Nose:   No congestion/rhinnorhea. No septal hematoma   Mouth/Throat:   MMM, no pharyngeal erythema. No peritonsillar mass.    Neck:   No stridor. No SubQ emphysema. No meningismus. Hematological/Lymphatic/Immunilogical:   No cervical lymphadenopathy. Cardiovascular:   RRR. Symmetric bilateral radial and DP pulses.  No murmurs.  Respiratory:   Normal respiratory effort without tachypnea nor retractions. Breath sounds are clear and equal bilaterally. No wheezes/rales/rhonchi. Gastrointestinal:   Soft and nontender. Non distended. There is no CVA tenderness.  No rebound, rigidity, or  guarding. Genitourinary:   deferred Musculoskeletal:   Nontender with normal range of motion in all extremities. No joint effusions.  No lower extremity tenderness.  No edema. Neurologic:   Normal speech and language.  CN 2-10 normal. Motor grossly intact. No gross focal neurologic deficits are appreciated.  Skin:    Skin is warm, dry and intact. No rash noted.  No petechiae, purpura, or bullae. Psychiatric: Tearful, emotionally labile. Linear thought  ____________________________________________    LABS (pertinent positives/negatives) (all labs ordered are listed, but only abnormal results are displayed) Labs Reviewed  CBC - Abnormal; Notable for the following:    WBC 11.7 (*)    All other components within normal limits  COMPREHENSIVE METABOLIC PANEL - Abnormal; Notable for the following:    Total Protein 8.4 (*)    ALT 69 (*)    All other components within normal limits  URINALYSIS COMPLETEWITH MICROSCOPIC ([facility] ONLY) - Abnormal; Notable for the following:    Color, Urine YELLOW (*)    APPearance CLEAR (*)    Bacteria, UA RARE (*)    Squamous Epithelial / LPF 0-5 (*)    All other components within normal limits  ETHANOL  URINE DRUG SCREEN, QUALITATIVE ([facility] ONLY)  POC URINE PREG, ED  POCT PREGNANCY, URINE   ____________________________________________   EKG    ____________________________________________    RADIOLOGY    ____________________________________________   PROCEDURES   ____________________________________________   INITIAL IMPRESSION / ASSESSMENT AND PLAN / ED COURSE  Pertinent labs & imaging results that were available during my care of the patient were reviewed by me and considered in my medical decision making (see chart for details).  Patient presents with symptoms of severe depression. Continue involuntary commitment, [facility]. We'll hold in the emergency.Department pending  transfer.     ____________________________________________   FINAL CLINICAL IMPRESSION(S) / ED DIAGNOSES  Final diagnoses:  Severe episode of recurrent major depressive disorder, without psychotic features (HCC)       Portions of this note were generated with dragon dictation software. Dictation errors may occur despite best attempts at proofreading.   Sharman CheekPhillip Delanna Blacketer, MD 12/10/15 2116

## 2015-12-10 NOTE — ED Notes (Signed)
Interpreter was used during assessment. She appears tearful and sad. She denies SI/HI/AVH. States she went to social services to get help and they sent her to [facility]. She states she believes there were issues with the translator on the phone and that the doctor must have thought she wanted to sleep forever but she just wants to sleep because she's having trouble due to a HA. She rates her HA at a 10. MD was notified and Tylenol 1000mg  was ordered. Snack provided. Safety maintained with 15 min checks.

## 2015-12-10 NOTE — ED Notes (Signed)
Pt sent here from [facility] after going to the health dept for depression, pt states they misinterpreted her comment and she states she is not suicidal at this time.

## 2015-12-11 NOTE — ED Notes (Signed)
Patient asleep in room. No noted distress or abnormal behavior. Will continue 15 minute checks and observation by security cameras for safety. 

## 2015-12-11 NOTE — ED Notes (Signed)
ENVIRONMENTAL ASSESSMENT Potentially harmful objects out of patient reach: Yes Personal belongings secured: Yes Patient dressed in hospital provided attire only: Yes Plastic bags out of patient reach: Yes Patient care equipment (cords, cables, call bells, lines, and drains) shortened, removed, or accounted for: Yes Equipment and supplies removed from bottom of stretcher: Yes Potentially toxic materials out of patient reach: Yes Sharps container removed or out of patient reach: Yes  Patient currently in room sleeping. No signs of acute distress noted. Maintained on 15 minute checks and observation by security camera for safety.  

## 2015-12-11 NOTE — ED Notes (Signed)
Patient resting quietly in room. No noted distress or abnormal behaviors noted. Will continue 15 minute checks and observation by security camera for safety. 

## 2015-12-11 NOTE — ED Notes (Signed)
Nurse went into room with translator to inform patient that she will be going to [facility]. Patient was unhappy to go and was told by nurse and translator that hospitalization came at the firm direction of Dr. Lenis NoonLevine. Patient was tearful and laid in the bed to rest. Will continue to monitor. Maintained on 15 minute checks and observation by security camera for safety.

## 2015-12-11 NOTE — ED Notes (Signed)
Patient denies SI/HI/AVH and pain. All belongings returned to patient (underwear, leggings, flip flops, tshirt). [facility].

## 2016-09-30 ENCOUNTER — Emergency Department: Payer: Self-pay

## 2016-09-30 ENCOUNTER — Emergency Department
Admission: EM | Admit: 2016-09-30 | Discharge: 2016-09-30 | Disposition: A | Discharge status: Home / Self Care | Payer: Self-pay | Attending: Student in an Organized Health Care Education/Training Program | Admitting: Student in an Organized Health Care Education/Training Program

## 2016-09-30 ENCOUNTER — Encounter: Payer: Self-pay | Admitting: Emergency Medicine

## 2016-09-30 DIAGNOSIS — R112 Nausea with vomiting, unspecified: Secondary | ICD-10-CM | POA: Insufficient documentation

## 2016-09-30 DIAGNOSIS — R7989 Other specified abnormal findings of blood chemistry: Secondary | ICD-10-CM

## 2016-09-30 DIAGNOSIS — R945 Abnormal results of liver function studies: Secondary | ICD-10-CM | POA: Insufficient documentation

## 2016-09-30 DIAGNOSIS — R05 Cough: Secondary | ICD-10-CM | POA: Insufficient documentation

## 2016-09-30 DIAGNOSIS — R1011 Right upper quadrant pain: Secondary | ICD-10-CM | POA: Insufficient documentation

## 2016-09-30 LAB — COMPREHENSIVE METABOLIC PANEL
ALK PHOS: 99 U/L (ref 38–126)
ALT: 57 U/L — AB (ref 14–54)
AST: 53 U/L — ABNORMAL HIGH (ref 15–41)
Albumin: 4 g/dL (ref 3.5–5.0)
Anion gap: 6 (ref 5–15)
BUN: 11 mg/dL (ref 6–20)
CALCIUM: 8.6 mg/dL — AB (ref 8.9–10.3)
CO2: 27 mmol/L (ref 22–32)
CREATININE: 0.57 mg/dL (ref 0.44–1.00)
Chloride: 107 mmol/L (ref 101–111)
Glucose, Bld: 103 mg/dL — ABNORMAL HIGH (ref 65–99)
Potassium: 3.5 mmol/L (ref 3.5–5.1)
SODIUM: 140 mmol/L (ref 135–145)
Total Bilirubin: 0.4 mg/dL (ref 0.3–1.2)
Total Protein: 7.5 g/dL (ref 6.5–8.1)

## 2016-09-30 LAB — CBC
HCT: 39.6 % (ref 35.0–47.0)
Hemoglobin: 13.9 g/dL (ref 12.0–16.0)
MCH: 31.4 pg (ref 26.0–34.0)
MCHC: 35.1 g/dL (ref 32.0–36.0)
MCV: 89.6 fL (ref 80.0–100.0)
Platelets: 150 10*3/uL (ref 150–440)
RBC: 4.42 MIL/uL (ref 3.80–5.20)
RDW: 13.2 % (ref 11.5–14.5)
WBC: 5.8 10*3/uL (ref 3.6–11.0)

## 2016-09-30 LAB — URINALYSIS, COMPLETE (UACMP) WITH MICROSCOPIC
BACTERIA UA: NONE SEEN
Bilirubin Urine: NEGATIVE
Glucose, UA: NEGATIVE mg/dL
Hgb urine dipstick: NEGATIVE
KETONES UR: NEGATIVE mg/dL
Leukocytes, UA: NEGATIVE
Nitrite: NEGATIVE
PROTEIN: NEGATIVE mg/dL
Specific Gravity, Urine: 1.014 (ref 1.005–1.030)
pH: 7 (ref 5.0–8.0)

## 2016-09-30 LAB — LIPASE, BLOOD: Lipase: 17 U/L (ref 11–51)

## 2016-09-30 LAB — PREGNANCY, URINE: PREG TEST UR: NEGATIVE

## 2016-09-30 MED ORDER — ONDANSETRON HCL 4 MG/2ML IJ SOLN
4.0000 mg | Freq: Once | INTRAMUSCULAR | Status: AC
  Administered 2016-09-30: 4 mg via INTRAVENOUS
  Filled 2016-09-30: qty 2

## 2016-09-30 MED ORDER — IOPAMIDOL (ISOVUE-300) INJECTION 61%
100.0000 mL | Freq: Once | INTRAVENOUS | Status: AC | PRN
  Administered 2016-09-30: 100 mL via INTRAVENOUS

## 2016-09-30 MED ORDER — NAPROXEN 500 MG PO TABS: 500.0000 mg | ORAL_TABLET | Freq: Two times a day (BID) | ORAL | 0 refills | Status: AC

## 2016-09-30 MED ORDER — DICYCLOMINE HCL 10 MG PO CAPS
10.0000 mg | ORAL_CAPSULE | Freq: Once | ORAL | Status: AC
  Administered 2016-09-30: 10 mg via ORAL
  Filled 2016-09-30: qty 1

## 2016-09-30 MED ORDER — DICYCLOMINE HCL 10 MG PO CAPS: 10.0000 mg | ORAL_CAPSULE | Freq: Three times a day (TID) | ORAL | 0 refills | Status: AC | PRN

## 2016-09-30 MED ORDER — MORPHINE SULFATE (PF) 4 MG/ML IV SOLN
4.0000 mg | Freq: Once | INTRAVENOUS | Status: AC
  Administered 2016-09-30: 4 mg via INTRAVENOUS
  Filled 2016-09-30: qty 1

## 2016-09-30 MED ORDER — SODIUM CHLORIDE 0.9 % IV BOLUS (SEPSIS)
1000.0000 mL | Freq: Once | INTRAVENOUS | Status: AC
  Administered 2016-09-30: 1000 mL via INTRAVENOUS

## 2016-09-30 NOTE — ED Provider Notes (Signed)
[facility] Emergency Department Provider Note  ____________________________________________  Time seen: Approximately 7:25 PM  I have reviewed the triage vital signs and the nursing notes.   HISTORY  Chief Complaint Abdominal Pain   HPI Kaitlyn Carey is a 36 y.o. female with a history of abdominal hysterectomy and cholecystectomy who presents for evaluation of abdominal pain. Patient reports sudden onset of right upper quadrant abdominal pain that started yesterday evening. Initially the pain was mild and got progressively worse throughout the day. She is currently complaining of severe stabbing pain located in her right lower quadrant radiating to her epigastric region that has been constant. She has had nausea and 3 episodes of nonbloody nonbilious emesis. She reports similar episode many years ago when she had her cholecystectomy done. She denies fever or chills, chest pain, shortness of breath, dysuria, hematuria, diarrhea, constipation. Last BM was yesterday and was normal. She also has had a dry cough since yesterday. She denies personal or family history blood clots, recent travel immobilization, leg pain or swelling, hemoptysis, or exogenous hormones.  History reviewed. No pertinent past medical history.  There are no active problems to display for this patient.   Past Surgical History:  Procedure Laterality Date  . CESAREAN SECTION    . CHOLECYSTECTOMY      Prior to Admission medications   Not on File    Allergies Patient has no known allergies.  No family history on file.  Social History Social History  Substance Use Topics  . Smoking status: Never Smoker  . Smokeless tobacco: Never Used  . Alcohol use No    Review of Systems  Constitutional: Negative for fever. Eyes: Negative for visual changes. ENT: Negative for sore throat. Neck: No neck pain  Cardiovascular: Negative for chest pain. Respiratory: Negative for  shortness of breath. + cough Gastrointestinal: + RUQ abdominal pain, nausea, and vomiting. No diarrhea. Genitourinary: Negative for dysuria. Musculoskeletal: Negative for back pain. Skin: Negative for rash. Neurological: Negative for headaches, weakness or numbness. Psych: No SI or HI  ____________________________________________   PHYSICAL EXAM:  VITAL SIGNS: ED Triage Vitals  Enc Vitals Group     BP 09/30/16 1841 (!) 134/94     Pulse Rate 09/30/16 1841 89     Resp 09/30/16 1841 17     Temp 09/30/16 1841 99.3 F (37.4 C)     Temp src --      SpO2 09/30/16 1841 98 %     Weight 09/30/16 1841 180 lb (81.6 kg)     Height 09/30/16 1841 5' (1.524 m)     Head Circumference --      Peak Flow --      Pain Score 09/30/16 1840 10     Pain Loc --      Pain Edu? --      Excl. in GC? --     Constitutional: Alert and oriented. Well appearing and in no apparent distress. HEENT:      Head: Normocephalic and atraumatic.         Eyes: Conjunctivae are normal. Sclera is non-icteric. EOMI. PERRL      Mouth/Throat: Mucous membranes are moist.       Neck: Supple with no signs of meningismus. Cardiovascular: Regular rate and rhythm. No murmurs, gallops, or rubs. 2+ symmetrical distal pulses are present in all extremities. No JVD. Respiratory: Normal respiratory effort. Lungs are clear to auscultation bilaterally. No wheezes, crackles, or rhonchi.  Gastrointestinal: Soft, ttp over the  RUQ and epigastric region, non distended with positive bowel sounds. No rebound or guarding. Genitourinary: No CVA tenderness. Musculoskeletal: Nontender with normal range of motion in all extremities. No edema, cyanosis, or erythema of extremities. Neurologic: Normal speech and language. Face is symmetric. Moving all extremities. No gross focal neurologic deficits are appreciated. Skin: Skin is warm, dry and intact. No rash noted. Psychiatric: Mood and affect are normal. Speech and behavior are  normal.  ____________________________________________   LABS (all labs ordered are listed, but only abnormal results are displayed)  Labs Reviewed  COMPREHENSIVE METABOLIC PANEL - Abnormal; Notable for the following:       Result Value   Glucose, Bld 103 (*)    Calcium 8.6 (*)    AST 53 (*)    ALT 57 (*)    All other components within normal limits  URINALYSIS, COMPLETE (UACMP) WITH MICROSCOPIC - Abnormal; Notable for the following:    Color, Urine YELLOW (*)    APPearance CLEAR (*)    Squamous Epithelial / LPF 0-5 (*)    All other components within normal limits  LIPASE, BLOOD  CBC  PREGNANCY, URINE   ____________________________________________  EKG  none ____________________________________________  RADIOLOGY  CXR: Negative  RUQ [place]: PND  ____________________________________________   PROCEDURES  Procedure(s) performed: None Procedures Critical Care performed:  None ____________________________________________   INITIAL IMPRESSION / ASSESSMENT AND PLAN / ED COURSE   36 y.o. female with a history of abdominal hysterectomy and cholecystectomy who presents for evaluation of RUQ abdominal pain associated with nausea and vomiting since yesterday. Patient is well-appearing, in no distress, vital signs show low-grade temp of 99.6F, remaining of her vital signs are within normal limits. Patient has tenderness to palpation on the right upper quadrant epigastric region with no rebound or guarding, abdomen is soft otherwise. Lungs are clear to auscultation. Patient does have a dry cough. Depression diagnoses including retained gallstone, kidney stone, appendicitis, bowel obstruction, pneumonia. PERC negative for PE.  Clinical Course as of Sep 30 2001  Wed Sep 30, 2016  1954 Labs showing mildly elevated LFTs. Will get RUQ [place] to rule out retained gallstone.  [CV]  2002 CXR negative for PNA. RUQ [place] pending, Care transferred to Dr. Roxan Hockey  [CV]    Clinical Course User  Index [CV] Nita Sickle, MD    Pertinent labs & imaging results that were available during my care of the patient were reviewed by me and considered in my medical decision making (see chart for details).    ____________________________________________   FINAL CLINICAL IMPRESSION(S) / ED DIAGNOSES  Final diagnoses:  RUQ abdominal pain  Elevated LFTs      NEW MEDICATIONS STARTED DURING THIS VISIT:  New Prescriptions   No medications on file     Note:  This document was prepared using Dragon voice recognition software and may include unintentional dictation errors.    Nita Sickle, MD 09/30/16 2003

## 2016-09-30 NOTE — ED Triage Notes (Signed)
Patient presents to ED via POV with c/o RUQ pain. Patient was seen here for same about a year ago and was told her liver was inflamed. Emesis x4 today. Patient crying in triage.

## 2016-09-30 NOTE — ED Provider Notes (Signed)
Patient received in sign-out from Dr. Don Perking.  Workup and evaluation pending RUQ [place] and CT imaging.  Imaging without acute abnormality.  Labs reassuring.  Repeat abd exam soft and benign. Patient is stable for follow-up with PCP.  Patient was able to tolerate PO and was able to ambulate with a steady gait.  Have discussed with the patient and available family all diagnostics and treatments performed thus far and all questions were answered to the best of my ability. The patient demonstrates understanding and agreement with plan.       Willy Eddy, MD 09/30/16 [phone]

## 2016-09-30 NOTE — ED Notes (Signed)
Interpreter Stratus at bedside for Spanish

## 2016-10-01 ENCOUNTER — Encounter: Payer: Self-pay | Admitting: Psychiatry

## 2019-09-24 ENCOUNTER — Ambulatory Visit: Payer: Self-pay | Attending: Internal Medicine

## 2019-09-24 DIAGNOSIS — Z23 Encounter for immunization: Secondary | ICD-10-CM

## 2019-09-24 NOTE — Progress Notes (Signed)
   Covid-19 Vaccination Clinic  Name:  Kaitlyn Carey    MRN: [id number] DOB: March 07, 1981  09/24/2019  Ms. Kaitlyn Carey was observed post Covid-19 immunization for 15 minutes without incident. She was provided with Vaccine Information Sheet and instruction to access the V-Safe system.   Ms. Kaitlyn Carey was instructed to call 911 with any severe reactions post vaccine: Marland Kitchen Difficulty breathing  . Swelling of face and throat  . A fast heartbeat  . A bad rash all over body  . Dizziness and weakness   Immunizations Administered    Name Date Dose VIS Date Route   Pfizer COVID-19 Vaccine 09/24/2019  4:12 PM 0.3 mL 06/09/2019 Intramuscular   Manufacturer: [employer], [employer]   Lot: [id number]   NDC: [address]-2

## 2019-10-15 ENCOUNTER — Ambulatory Visit: Payer: Self-pay

## 2021-10-24 ENCOUNTER — Emergency Department
Admission: EM | Admit: 2021-10-24 | Discharge: 2021-10-24 | Disposition: A | Discharge status: Home / Self Care | Payer: Self-pay | Attending: Emergency Medicine | Admitting: Emergency Medicine

## 2021-10-24 ENCOUNTER — Emergency Department: Payer: Self-pay

## 2021-10-24 ENCOUNTER — Encounter: Payer: Self-pay | Admitting: Emergency Medicine

## 2021-10-24 ENCOUNTER — Other Ambulatory Visit: Payer: Self-pay

## 2021-10-24 DIAGNOSIS — R102 Pelvic and perineal pain: Secondary | ICD-10-CM | POA: Insufficient documentation

## 2021-10-24 LAB — CBC
HCT: 43.5 % (ref 36.0–46.0)
Hemoglobin: 14.6 g/dL (ref 12.0–15.0)
MCH: 30.9 pg (ref 26.0–34.0)
MCHC: 33.6 g/dL (ref 30.0–36.0)
MCV: 92.2 fL (ref 80.0–100.0)
Platelets: 185 10*3/uL (ref 150–400)
RBC: 4.72 MIL/uL (ref 3.87–5.11)
RDW: 12.7 % (ref 11.5–15.5)
WBC: 9.9 10*3/uL (ref 4.0–10.5)
nRBC: 0 % (ref 0.0–0.2)

## 2021-10-24 LAB — BASIC METABOLIC PANEL
Anion gap: 10 (ref 5–15)
BUN: 9 mg/dL (ref 6–20)
CO2: 28 mmol/L (ref 22–32)
Calcium: 9.9 mg/dL (ref 8.9–10.3)
Chloride: 103 mmol/L (ref 98–111)
Creatinine, Ser: 0.64 mg/dL (ref 0.44–1.00)
GFR, Estimated: >60 mL/min (ref >60)
Glucose, Bld: 89 mg/dL (ref 70–99)
Potassium: 3.5 mmol/L (ref 3.5–5.1)
Sodium: 141 mmol/L (ref 135–145)

## 2021-10-24 LAB — WET PREP, GENITAL
Clue Cells Wet Prep HPF POC: NONE SEEN
Sperm: NONE SEEN
Trich, Wet Prep: NONE SEEN
WBC, Wet Prep HPF POC: >10 — AB (ref <10)
Yeast Wet Prep HPF POC: NONE SEEN

## 2021-10-24 LAB — URINALYSIS, ROUTINE W REFLEX MICROSCOPIC
Bilirubin Urine: NEGATIVE
Glucose, UA: NEGATIVE mg/dL
Hgb urine dipstick: NEGATIVE
Ketones, ur: NEGATIVE mg/dL
Leukocytes,Ua: NEGATIVE
Nitrite: NEGATIVE
Protein, ur: NEGATIVE mg/dL
Specific Gravity, Urine: 1.008 (ref 1.005–1.030)
pH: 6 (ref 5.0–8.0)

## 2021-10-24 MED ORDER — ACETAMINOPHEN 325 MG PO TABS
650.0000 mg | ORAL_TABLET | Freq: Once | ORAL | Status: AC
  Administered 2021-10-24: 650 mg via ORAL
  Filled 2021-10-24: qty 2

## 2021-10-24 NOTE — ED Provider Notes (Signed)
? ?[facility] ?Provider Note ? ?Patient Contact: 9:08 PM (approximate) ? ? ?History  ? ?Flank Pain ? ? ?HPI ? ?Kaitlyn Carey is a 41 y.o. female presents to the emergency department with vaginal pain the patient states seems to radiate along her pelvis bilaterally.  Patient states that she has had intermittent vaginal itching and changes in vaginal odor.  She denies deep dyspareunia.  She has occasional dysuria but no increased urinary frequency.  No prior history of nephrolithiasis or pyelonephritis. ? ?  ? ? ?Physical Exam  ? ?Triage Vital Signs: ?ED Triage Vitals  ?Enc Vitals Group  ?   BP 10/24/21 1913 (!) 136/95  ?   Pulse Rate 10/24/21 1913 72  ?   Resp 10/24/21 1913 16  ?   Temp 10/24/21 1913 98.8 ?F (37.1 ?C)  ?   Temp Source 10/24/21 1913 Oral  ?   SpO2 --   ?   Weight 10/24/21 1916 144 lb (65.3 kg)  ?   Height 10/24/21 1916 4\' 9"  (1.448 m)  ?   Head Circumference --   ?   Peak Flow --   ?   Pain Score 10/24/21 1915 10  ?   Pain Loc --   ?   Pain Edu? --   ?   Excl. in GC? --   ? ? ?Most recent vital signs: ?Vitals:  ? 10/24/21 1913 10/24/21 2150  ?BP: (!) 136/95 132/90  ?Pulse: 72 80  ?Resp: 16 18  ?Temp: 98.8 ?F (37.1 ?C) 98.5 ?F (36.9 ?C)  ?SpO2:  98%  ? ? ? ?General: Alert and in no acute distress. ?Eyes:  PERRL. EOMI. ?Head: No acute traumatic findings ?ENT: ?     Nose: No congestion/rhinnorhea. ?     Mouth/Throat: Mucous membranes are moist.  ?Neck: No stridor. No cervical spine tenderness to palpation. ?Cardiovascular:  Good peripheral perfusion ?Respiratory: Normal respiratory effort without tachypnea or retractions. Lungs CTAB. Good air entry to the bases with no decreased or absent breath sounds. ?Gastrointestinal: Bowel sounds ?4 quadrants. Soft and nontender to palpation. No guarding or rigidity. No palpable masses. No distention. No CVA tenderness. ?Musculoskeletal: Full range of motion to all extremities.  ?Neurologic:  No gross focal neurologic deficits  are appreciated.  ?Skin:   No rash noted ?Other: ? ? ?ED Results / Procedures / Treatments  ? ?Labs ?(all labs ordered are listed, but only abnormal results are displayed) ?Labs Reviewed  ?WET PREP, GENITAL - Abnormal; Notable for the following components:  ?    Result Value  ? WBC, Wet Prep HPF POC >10 (*)   ? All other components within normal limits  ?URINALYSIS, ROUTINE W REFLEX MICROSCOPIC - Abnormal; Notable for the following components:  ? Color, Urine STRAW (*)   ? APPearance CLEAR (*)   ? All other components within normal limits  ?BASIC METABOLIC PANEL  ?CBC  ?POC URINE PREG, ED  ? ? ? ? ? ?PROCEDURES: ? ?Critical Care performed: No ? ?Procedures ? ? ?MEDICATIONS ORDERED IN ED: ?Medications  ?acetaminophen (TYLENOL) tablet 650 mg (650 mg Oral Given 10/24/21 1920)  ? ? ? ?IMPRESSION / MDM / ASSESSMENT AND PLAN / ED COURSE  ?I reviewed the triage vital signs and the nursing notes. ?             ?               ?Assessment and plan: ?Vaginal pain:  ?Differential diagnosis includes,  but is not limited to, yeast vaginitis, BV, nephrolithiasis, UTI ? ?41 year old female presents to the emergency department with vaginal pain that radiates to the lower abdomen. ? ?Urinalysis shows no signs of UTI.  CT renal stone study showed no adnexal abnormalities and no evidence of nephrolithiasis. ?Wet prep showed no signs of yeast or BV.  We will have patient follow-up with gynecology.  Return precautions were given to return with new or worsening symptoms. ? ?FINAL CLINICAL IMPRESSION(S) / ED DIAGNOSES  ? ?Final diagnoses:  ?Vaginal pain  ? ? ? ?Rx / DC Orders  ? ?ED Discharge Orders   ? ? None  ? ?  ? ? ? ?Note:  This document was prepared using Dragon voice recognition software and may include unintentional dictation errors. ?  ?Orvil Feil, PA-C ?10/24/21 2224 ? ?  ?Shaune Pollack, MD ?10/25/21 1528 ? ?

## 2021-10-24 NOTE — ED Triage Notes (Signed)
Pt reports left flank and left lower back pain since Monday, reports Urinary symptoms. Fever at home reports did not check temperature at home. Pt talks in complete sentences no respiratory distress noted  ?

## 2021-10-24 NOTE — Discharge Instructions (Signed)
Please make follow-up appointment with Dr. Beasley. ?

## 2022-05-13 ENCOUNTER — Other Ambulatory Visit: Payer: Self-pay

## 2022-05-13 ENCOUNTER — Emergency Department
Admission: EM | Admit: 2022-05-13 | Discharge: 2022-05-13 | Disposition: A | Discharge status: Home / Self Care | Payer: Self-pay | Attending: Emergency Medicine | Admitting: Emergency Medicine

## 2022-05-13 DIAGNOSIS — Z20822 Contact with and (suspected) exposure to covid-19: Secondary | ICD-10-CM | POA: Insufficient documentation

## 2022-05-13 DIAGNOSIS — B349 Viral infection, unspecified: Secondary | ICD-10-CM | POA: Insufficient documentation

## 2022-05-13 LAB — RESP PANEL BY RT-PCR (FLU A&B, COVID) ARPGX2
Influenza A by PCR: NEGATIVE
Influenza B by PCR: NEGATIVE
SARS Coronavirus 2 by RT PCR: NEGATIVE

## 2022-05-13 NOTE — ED Provider Notes (Signed)
[facility] Provider Note    Event Date/Time   First MD Initiated Contact with Patient 05/13/22 1217     (approximate)   History   Cough   HPI  Kaitlyn Carey is a 41 y.o. female presents to the emergency department for treatment and evaluation of URI symptoms that started 5 or 6 days ago.  She has no chronic medical history.  She has been taking over-the-counter medications without any relief.  Her cough is continuous and seems to be worse at night and is now productive of clear sputum.  Family with similar symptoms.     Physical Exam   Triage Vital Signs: ED Triage Vitals  Enc Vitals Group     BP 05/13/22 1157 (!) 130/100     Pulse Rate 05/13/22 1157 72     Resp 05/13/22 1157 20     Temp 05/13/22 1157 98.1 F (36.7 C)     Temp src --      SpO2 05/13/22 1157 100 %     Weight --      Height --      Head Circumference --      Peak Flow --      Pain Score 05/13/22 1149 4     Pain Loc --      Pain Edu? --      Excl. in GC? --     Most recent vital signs: Vitals:   05/13/22 1157 05/13/22 1440  BP: (!) 130/100 (!) 126/100  Pulse: 72 79  Resp: 20 16  Temp: 98.1 F (36.7 C) 98.6 F (37 C)  SpO2: 100% 97%     General: Awake, no distress.  CV:  Good peripheral perfusion.  Resp:  Normal effort. Breath sounds clear to auscultation Abd:  No distention. No focal abdominal tenderness. Other:                ED Results / Procedures / Treatments   Labs (all labs ordered are listed, but only abnormal results are displayed) Labs Reviewed  RESP PANEL BY RT-PCR (FLU A&B, COVID) [id number]     EKG  Not indiicated.   RADIOLOGY  Not indicated.   PROCEDURES:  Critical Care performed: No  Procedures   MEDICATIONS ORDERED IN ED: Medications - No data to display   IMPRESSION / MDM / ASSESSMENT AND PLAN / ED COURSE  I reviewed the triage vital signs and the nursing notes.                              Differential  diagnosis includes, but is not limited to, Covid, influenza, viral syndrome  Patient's presentation is most consistent with acute illness / injury with system symptoms.  41 year old female presents to the emergency department for treatment and evaluation of viral symptoms as described in the HPI. Plan will be to get respiratory panel.  COVID and influenza testing are negative. She will be advised of symptomatic treatment and prescribed cough suppressant. She is to follow up with primary care if not improving over the next few days.     FINAL CLINICAL IMPRESSION(S) / ED DIAGNOSES   Final diagnoses:  Acute viral syndrome     Rx / DC Orders   ED Discharge Orders     None        Note:  This document was prepared using Dragon voice recognition software and may include unintentional dictation  errors.   Chinita Pester, FNP 05/13/22 1809    Merwyn Katos, MD 05/14/22 2351

## 2022-05-13 NOTE — ED Triage Notes (Signed)
Pt comes with c/o cough and cold symptoms. Pt states this all started last week. No flu or covid test taken.

## 2022-06-03 ENCOUNTER — Ambulatory Visit (LOCAL_COMMUNITY_HEALTH_CENTER): Payer: Self-pay

## 2022-06-03 DIAGNOSIS — Z23 Encounter for immunization: Secondary | ICD-10-CM

## 2022-06-03 DIAGNOSIS — Z7185 Encounter for immunization safety counseling: Secondary | ICD-10-CM

## 2022-06-03 NOTE — Progress Notes (Signed)
  Are you feeling sick today? No  Runny nose / denies fever   Have you ever received a dose of COVID-19 Vaccine? [employer], [place], [place], Novavax, Other) Yes  If yes, which vaccine and how many doses?  Pfizer and 2 doses    Did you bring the vaccination record card or other documentation?  No   Do you have a health condition or are undergoing treatment that makes you moderately or severely immunocompromised? This would include, but not be limited to: cancer, HIV, organ transplant, immunosuppressive therapy/high-dose corticosteroids, or moderate/severe primary immunodeficiency.  No  Have you received COVID-19 vaccine before or during hematopoietic cell transplant (HCT) or CAR-T-cell therapies? No  Have you ever had an allergic reaction to: (This would include a severe allergic reaction or a reaction that caused hives, swelling, or respiratory distress, including wheezing.) A component of a COVID-19 vaccine or a previous dose of COVID-19 vaccine? No   Have you ever had an allergic reaction to another vaccine (other thanCOVID-19 vaccine) or an injectable medication? (This would include a severe allergic reaction or a reaction that caused hives, swelling, or respiratory distress, including wheezing.)   No    Do you have a history of any of the following:  Myocarditis or Pericarditis No  Dermal fillers:  No  Multisystem Inflammatory Syndrome (MIS-C or MIS-A)? No  COVID-19 disease within the past 3 months? No  Vaccinated with monkeypox vaccine in the last 4 weeks? No  Covid Pfizer Comirnaty [phone] (34yrs+)  [employer] [facility] given and flu vaccine given. Tolerated vaccines well today. Stayed for 15 min observation without problem.Updated [facility] copy given and explained. Pt declines interpreter today. Speaks and understands some English and answered questions without difficulty.  Jerel Shepherd, RN

## 2023-01-17 IMAGING — CT CT RENAL STONE PROTOCOL
2 of 4 series · 17 of 46 positions shown, 19 images · non-contrast
Comparison: September 30, 2016

CLINICAL DATA: Left flank pain and lower back pain.



[Series 2: stone full standard · axial · 0.73mm/px · z∈[-736,-356]mm · 14 of 84 slices shown, 16 images]
[im 4/84  soft-tissue]
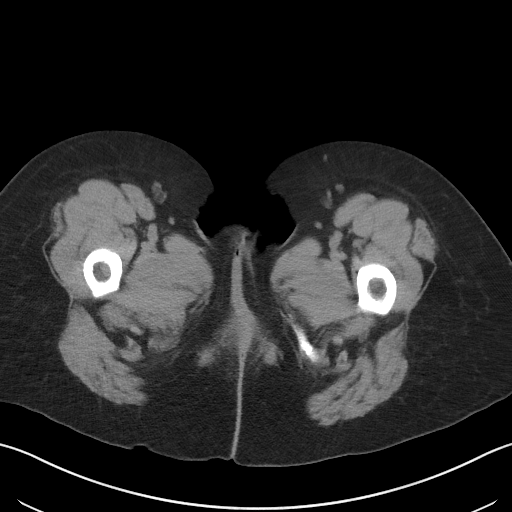
[im 4/84  bone]
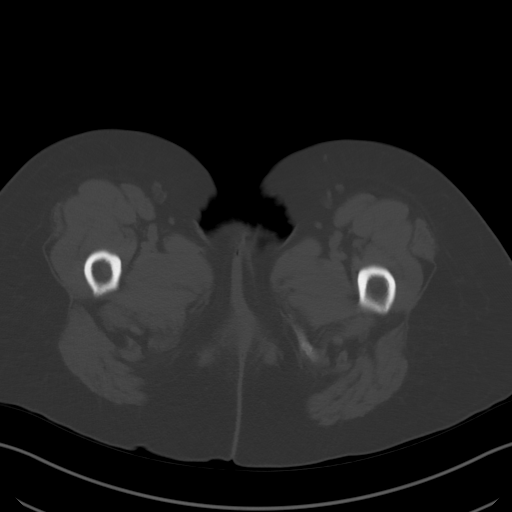
[im 11/84  soft-tissue]
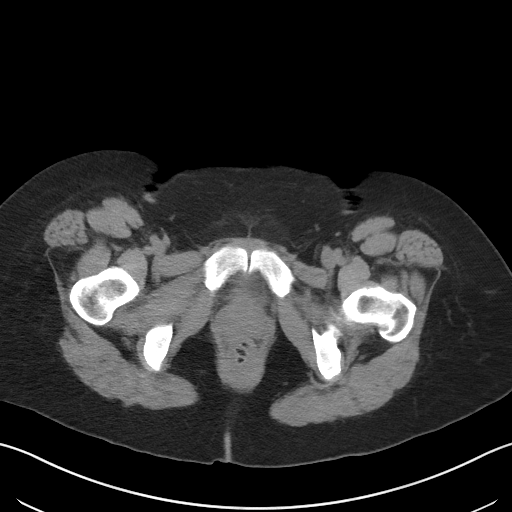
[im 18/84  soft-tissue]
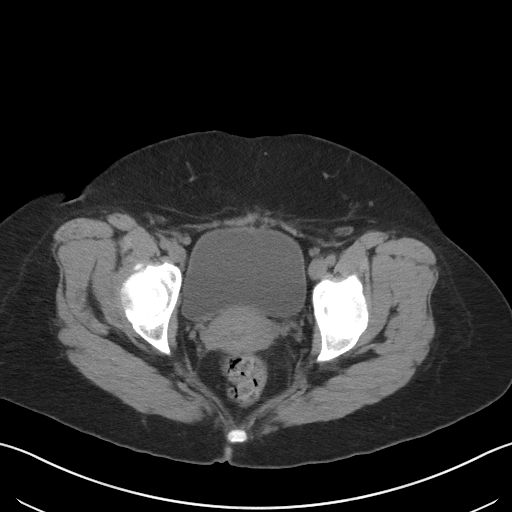
[im 21/84  soft-tissue]
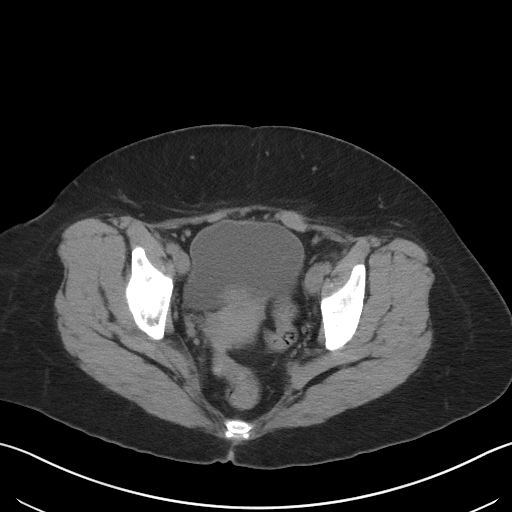
[im 28/84  soft-tissue]
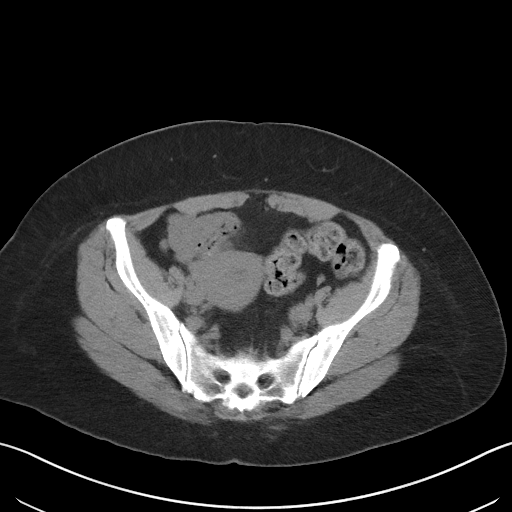
[im 35/84  soft-tissue]
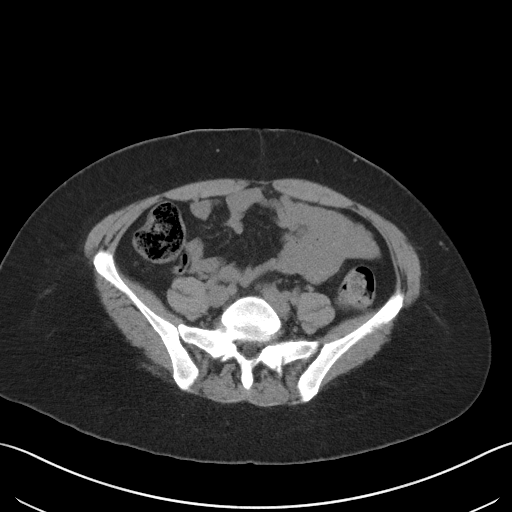
[im 39/84  soft-tissue]
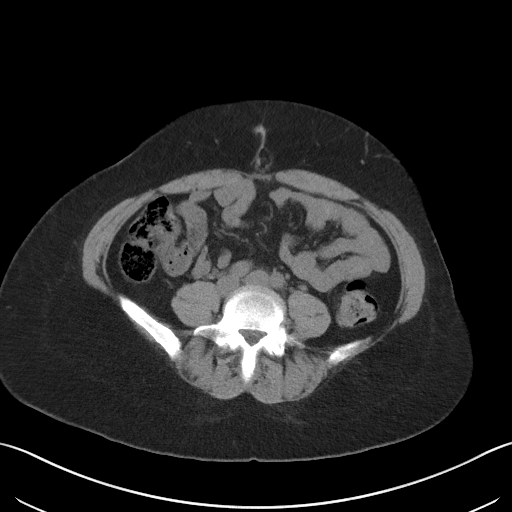
[im 45/84  soft-tissue]
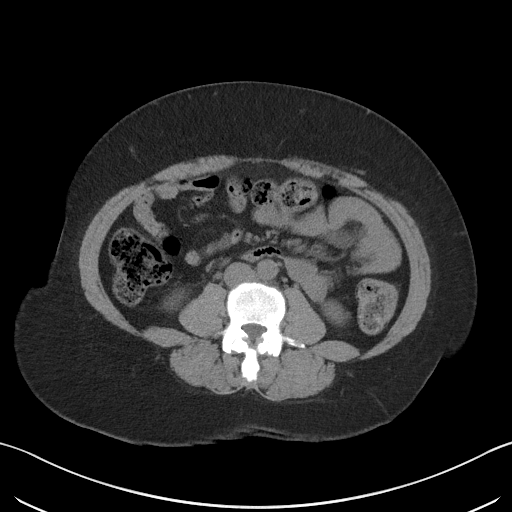
[im 49/84  soft-tissue]
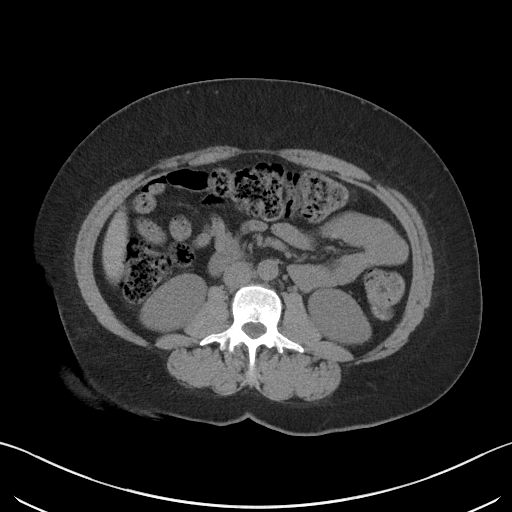
[im 49/84  bone]
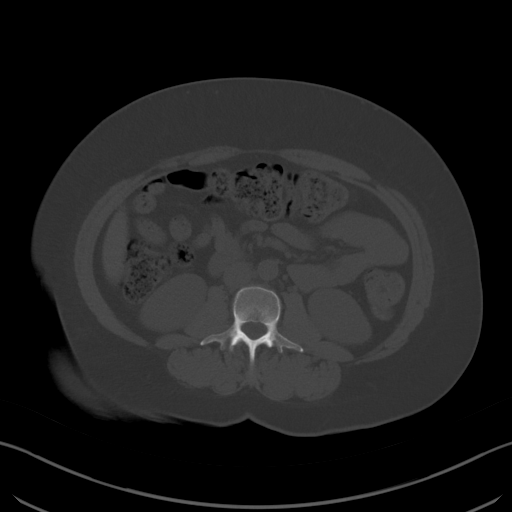
[im 56/84  soft-tissue]
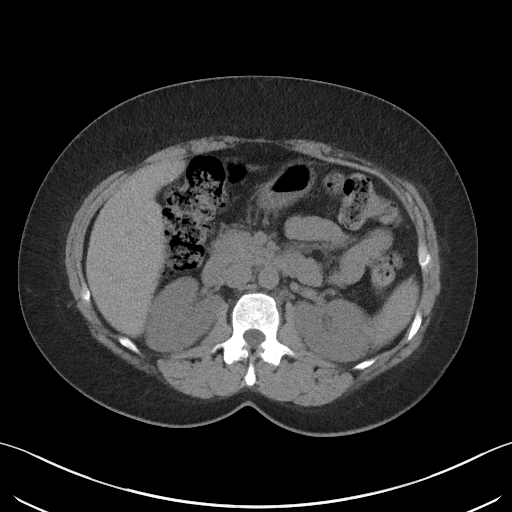
[im 63/84  soft-tissue]
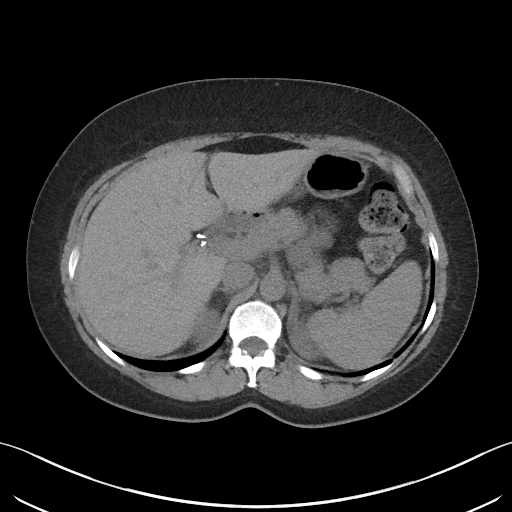
[im 66/84  soft-tissue]
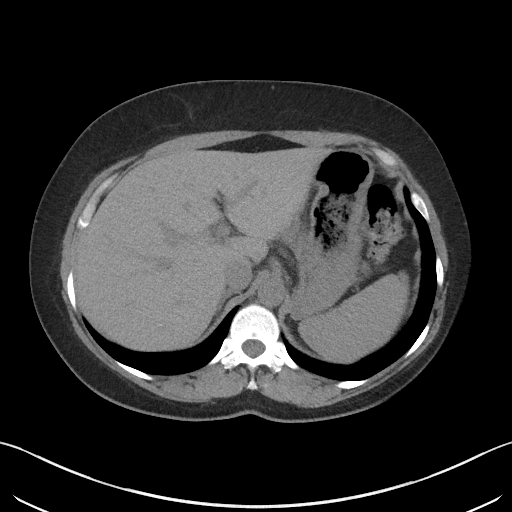
[im 73/84  soft-tissue]
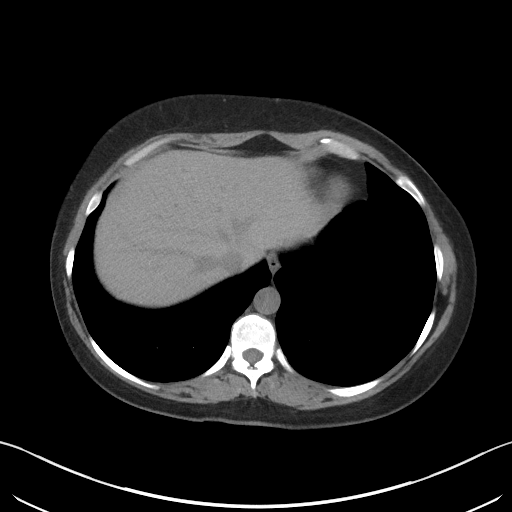
[im 80/84  soft-tissue]
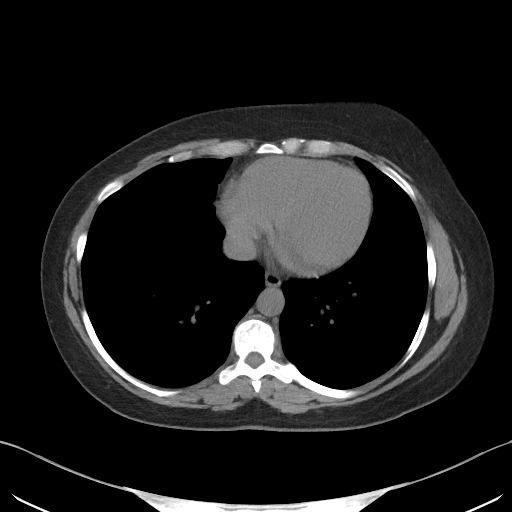

[Series 5: coronal · coronal · 0.81mm/px · 3 of 128 slices shown]
[im 43/128  soft-tissue]
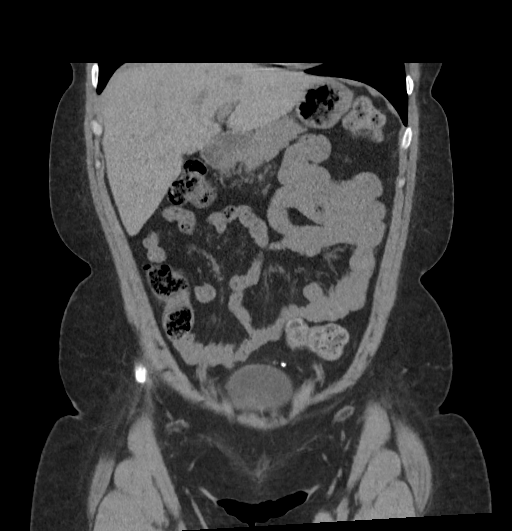
[im 57/128  soft-tissue]
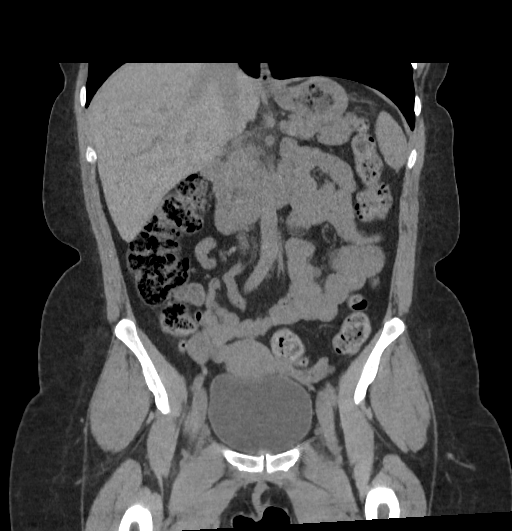
[im 71/128  soft-tissue]
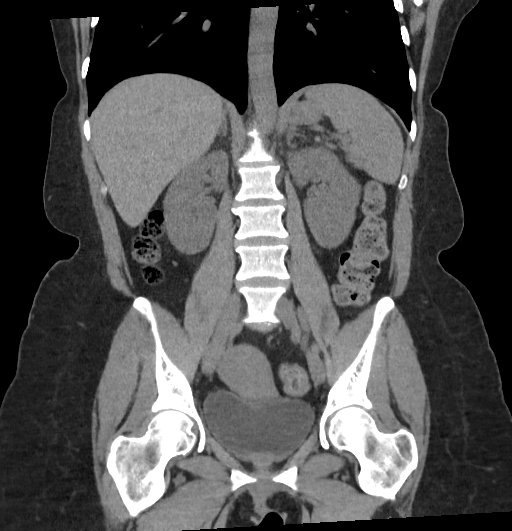

[17 of 46 positions shown; findings below may reference images not displayed]

FINDINGS: Lower chest: No acute abnormality.

Hepatobiliary: No focal liver abnormality is seen. Status post
cholecystectomy. No biliary dilatation.

Pancreas: Unremarkable. No pancreatic ductal dilatation or
surrounding inflammatory changes.

Spleen: Normal in size without focal abnormality.

Adrenals/Urinary Tract: Adrenal glands are unremarkable. Kidneys are
normal, without renal calculi, focal lesion, or hydronephrosis.
Bladder is unremarkable.

Stomach/Bowel: Stomach is within normal limits. Appendix appears
normal. No evidence of bowel wall thickening, distention, or
inflammatory changes.

Vascular/Lymphatic: No significant vascular findings are present. No
enlarged abdominal or pelvic lymph nodes.

Reproductive: Uterus and bilateral adnexa are unremarkable.

Other: Very small, stable bilateral fat containing para umbilical
hernias are noted. No abdominopelvic ascites.

Musculoskeletal: No acute or significant osseous findings.
IMPRESSION: 1. No acute or active process within the abdomen or pelvis.
2. Evidence of prior cholecystectomy.
